# Patient Record
Sex: Male | Born: 1950
Health system: Southern US, Community
[De-identification: ages and names within clinical notes are randomized; demographics above are authoritative.]

## PROBLEM LIST (undated history)

## (undated) DIAGNOSIS — I493 Ventricular premature depolarization: Secondary | ICD-10-CM

## (undated) DIAGNOSIS — J40 Bronchitis, not specified as acute or chronic: Secondary | ICD-10-CM

## (undated) DIAGNOSIS — E785 Hyperlipidemia, unspecified: Secondary | ICD-10-CM

## (undated) DIAGNOSIS — J0100 Acute maxillary sinusitis, unspecified: Secondary | ICD-10-CM

## (undated) DIAGNOSIS — I10 Essential (primary) hypertension: Secondary | ICD-10-CM

## (undated) DIAGNOSIS — E1165 Type 2 diabetes mellitus with hyperglycemia: Secondary | ICD-10-CM

## (undated) DIAGNOSIS — E782 Mixed hyperlipidemia: Secondary | ICD-10-CM

## (undated) DIAGNOSIS — Z0000 Encounter for general adult medical examination without abnormal findings: Secondary | ICD-10-CM

## (undated) HISTORY — DX: Mixed hyperlipidemia: E78.2

## (undated) HISTORY — DX: Encounter for general adult medical examination without abnormal findings: Z00.00

## (undated) HISTORY — DX: Essential (primary) hypertension: I10

## (undated) HISTORY — DX: Type 2 diabetes mellitus with hyperglycemia: E11.65

## (undated) HISTORY — PX: SHOULDER SURGERY: SHX246

## (undated) HISTORY — DX: Ventricular premature depolarization: I49.3

## (undated) HISTORY — DX: Hyperlipidemia, unspecified: E78.5

## (undated) HISTORY — DX: Bronchitis, not specified as acute or chronic: J40

## (undated) HISTORY — DX: Acute maxillary sinusitis, unspecified: J01.00

---

## 2012-12-10 HISTORY — PX: COLONOSCOPY: SHX174

## 2015-03-31 DIAGNOSIS — I493 Ventricular premature depolarization: Secondary | ICD-10-CM

## 2015-03-31 DIAGNOSIS — E785 Hyperlipidemia, unspecified: Secondary | ICD-10-CM

## 2015-03-31 DIAGNOSIS — I10 Essential (primary) hypertension: Secondary | ICD-10-CM

## 2015-03-31 HISTORY — DX: Hyperlipidemia, unspecified: E78.5

## 2015-03-31 HISTORY — DX: Essential (primary) hypertension: I10

## 2015-03-31 HISTORY — DX: Ventricular premature depolarization: I49.3

## 2017-02-16 DIAGNOSIS — J40 Bronchitis, not specified as acute or chronic: Secondary | ICD-10-CM | POA: Insufficient documentation

## 2017-02-16 HISTORY — DX: Bronchitis, not specified as acute or chronic: J40

## 2017-02-20 DIAGNOSIS — J0100 Acute maxillary sinusitis, unspecified: Secondary | ICD-10-CM

## 2017-02-20 HISTORY — DX: Acute maxillary sinusitis, unspecified: J01.00

## 2017-03-10 DIAGNOSIS — E1165 Type 2 diabetes mellitus with hyperglycemia: Secondary | ICD-10-CM

## 2017-03-10 DIAGNOSIS — Z0000 Encounter for general adult medical examination without abnormal findings: Secondary | ICD-10-CM

## 2017-03-10 DIAGNOSIS — E782 Mixed hyperlipidemia: Secondary | ICD-10-CM

## 2017-03-10 HISTORY — DX: Type 2 diabetes mellitus with hyperglycemia: E11.65

## 2017-03-10 HISTORY — DX: Mixed hyperlipidemia: E78.2

## 2017-03-10 HISTORY — DX: Encounter for general adult medical examination without abnormal findings: Z00.00

## 2017-06-19 ENCOUNTER — Ambulatory Visit: Payer: Self-pay | Admitting: Cardiology

## 2017-06-22 NOTE — Progress Notes (Deleted)
Cardiology Office Note:    Date:  06/22/2017   ID:  Russell Wolf, DOB 12-20-1950, MRN [id number]  PCP:  Hadley Pen, MD  Cardiologist:  Norman Herrlich, MD    Referring MD: Hadley Pen, MD    ASSESSMENT:    No diagnosis found. PLAN:    In order of problems listed above:  1. ***   Next appointment: ***   Medication Adjustments/Labs and Tests Ordered: Current medicines are reviewed at length with the patient today.  Concerns regarding medicines are outlined above.  No orders of the defined types were placed in this encounter.  No orders of the defined types were placed in this encounter.   No chief complaint on file.   History of Present Illness:    Russell Wolf is a 67 y.o. male with a hx of Dyslipidemia, HTN, and PVCs  last seen 04/06/16. Compliance with diet, lifestyle and medications: *** Past Medical History:  Diagnosis Date  . Acute non-recurrent maxillary sinusitis 02/20/2017  . Annual physical exam 03/10/2017  . Bronchitis 02/16/2017  . Essential hypertension 03/31/2015  . Hyperlipidemia 03/31/2015  . Mixed dyslipidemia 03/10/2017  . PVC's (premature ventricular contractions) 03/31/2015  . Type 2 diabetes mellitus with hyperglycemia, without long-term current use of insulin (HCC) 03/10/2017    *** The histories are not reviewed yet. Please review them in the "History" navigator section and refresh this SmartLink.  Current Medications: No outpatient medications have been marked as taking for the 06/23/17 encounter (Appointment) with Baldo Daub, MD.     Allergies:   Niacin and Levofloxacin   Social History   Socioeconomic History  . Marital status: Married    Spouse name: Not on file  . Number of children: Not on file  . Years of education: Not on file  . Highest education level: Not on file  Occupational History  . Not on file  Social Needs  . Financial resource strain: Not on file  . Food insecurity:    Worry: Not on file   Inability: Not on file  . Transportation needs:    Medical: Not on file    Non-medical: Not on file  Tobacco Use  . Smoking status: Never Smoker  . Smokeless tobacco: Never Used  Substance and Sexual Activity  . Alcohol use: Not on file  . Drug use: Not on file  . Sexual activity: Not on file  Lifestyle  . Physical activity:    Days per week: Not on file    Minutes per session: Not on file  . Stress: Not on file  Relationships  . Social connections:    Talks on phone: Not on file    Gets together: Not on file    Attends religious service: Not on file    Active member of club or organization: Not on file    Attends meetings of clubs or organizations: Not on file    Relationship status: Not on file  Other Topics Concern  . Not on file  Social History Narrative  . Not on file     Family History: The patient's ***family history includes CAD in his father. ROS:   Please see the history of present illness.    All other systems reviewed and are negative.  EKGs/Labs/Other Studies Reviewed:    The following studies were reviewed today:  EKG:  EKG ordered today.  The ekg ordered today demonstrates ***  Recent Labs:   06/14/17  CMP normal A1C 6.3% No results found for  requested labs within last 8760 hours.  Recent Lipid Panel   Chol 97 HDL 32 LDL 54 No results found for: CHOL, TRIG, HDL, CHOLHDL, VLDL, LDLCALC, LDLDIRECT  Physical Exam:    VS:  There were no vitals taken for this visit.    Wt Readings from Last 3 Encounters:  No data found for Wt     GEN: *** Well nourished, well developed in no acute distress HEENT: Normal NECK: No JVD; No carotid bruits LYMPHATICS: No lymphadenopathy CARDIAC: ***RRR, no murmurs, rubs, gallops RESPIRATORY:  Clear to auscultation without rales, wheezing or rhonchi  ABDOMEN: Soft, non-tender, non-distended MUSCULOSKELETAL:  No edema; No deformity  SKIN: Warm and dry NEUROLOGIC:  Alert and oriented x 3 PSYCHIATRIC:  Normal affect      Signed, Norman HerrlichBrian Munley, MD  06/22/2017 8:31 PM    [facility]

## 2017-06-23 ENCOUNTER — Ambulatory Visit (INDEPENDENT_AMBULATORY_CARE_PROVIDER_SITE_OTHER): Payer: 59 | Admitting: Cardiology

## 2017-06-23 ENCOUNTER — Encounter: Payer: Self-pay | Admitting: Cardiology

## 2017-06-23 ENCOUNTER — Ambulatory Visit: Payer: Self-pay | Admitting: Cardiology

## 2017-06-23 VITALS — BP 102/70 | HR 69 | Ht 74.0 in | Wt 191.0 lb

## 2017-06-23 DIAGNOSIS — E7800 Pure hypercholesterolemia, unspecified: Secondary | ICD-10-CM

## 2017-06-23 DIAGNOSIS — I10 Essential (primary) hypertension: Secondary | ICD-10-CM | POA: Diagnosis not present

## 2017-06-23 DIAGNOSIS — E1165 Type 2 diabetes mellitus with hyperglycemia: Secondary | ICD-10-CM

## 2017-06-23 DIAGNOSIS — I493 Ventricular premature depolarization: Secondary | ICD-10-CM

## 2017-06-23 NOTE — Patient Instructions (Addendum)

## 2017-06-23 NOTE — Progress Notes (Signed)
Cardiology Office Note:    Date:  06/23/2017   ID:  Russell Otoavid Getter, DOB January 24, 1951, MRN [id number]  PCP:  Hadley Penobbins, Robert A, MD  Cardiologist:  Norman HerrlichBrian Munley, MD    Referring MD: Hadley Penobbins, Robert A, MD    ASSESSMENT:    1. Essential hypertension   2. PVC's (premature ventricular contractions)   3. Pure hypercholesterolemia   4. Type 2 diabetes mellitus with hyperglycemia, without long-term current use of insulin (HCC)    PLAN:    In order of problems listed above:  1. Stable blood pressure target continue treatment beta-blocker 2. Stable continue beta-blocker 3. Stable continue statin recent lipids are at target 4. Stable well controlled continue metformin   Next appointment: One year   Medication Adjustments/Labs and Tests Ordered: Current medicines are reviewed at length with the patient today.  Concerns regarding medicines are outlined above.  Orders Placed This Encounter  Procedures  . EKG 12-Lead   No orders of the defined types were placed in this encounter.   Chief Complaint  Patient presents with  . Follow-up    PVC's  . Medication Refill    Amlodipine  . Hypertension  . Hyperlipidemia    History of Present Illness:    Russell Wolf is a 67 y.o. male with a hx of Dyslipidemia, HTN, and PVCs  last seen 04/06/16. Compliance with diet, lifestyle and medications: yes He has had a good year no breakthrough episodes of palpitation remains active has had no chest pain syncope or shortness of breath. Past Medical History:  Diagnosis Date  . Acute non-recurrent maxillary sinusitis 02/20/2017  . Annual physical exam 03/10/2017  . Bronchitis 02/16/2017  . Essential hypertension 03/31/2015  . Hyperlipidemia 03/31/2015  . Mixed dyslipidemia 03/10/2017  . PVC's (premature ventricular contractions) 03/31/2015  . Type 2 diabetes mellitus with hyperglycemia, without long-term current use of insulin (HCC) 03/10/2017    Past Surgical History:  Procedure Laterality Date    . SHOULDER SURGERY      Current Medications: Current Meds  Medication Sig  . amLODipine-benazepril (LOTREL) 5-20 MG capsule Take by mouth.  Marland Kitchen. aspirin EC 81 MG tablet Take by mouth.  . carvedilol (COREG) 25 MG tablet Take by mouth.  . DOCOSAHEXAENOIC ACID PO Take 1 g by mouth.  . IRON PO Take by mouth daily.  Marland Kitchen. loratadine (CLARITIN) 10 MG tablet Take by mouth.  . metFORMIN (GLUCOPHAGE) 1000 MG tablet Take by mouth.  . Multiple Vitamin (MULTI-VITAMINS) TABS Take by mouth.  . rosuvastatin (CRESTOR) 20 MG tablet Take 10 mg by mouth 2 (two) times a week.     Allergies:   Niacin and Levofloxacin   Social History   Socioeconomic History  . Marital status: Married    Spouse name: Not on file  . Number of children: Not on file  . Years of education: Not on file  . Highest education level: Not on file  Occupational History  . Not on file  Social Needs  . Financial resource strain: Not on file  . Food insecurity:    Worry: Not on file    Inability: Not on file  . Transportation needs:    Medical: Not on file    Non-medical: Not on file  Tobacco Use  . Smoking status: Never Smoker  . Smokeless tobacco: Never Used  Substance and Sexual Activity  . Alcohol use: Not on file  . Drug use: Not on file  . Sexual activity: Not on file  Lifestyle  . Physical  activity:    Days per week: Not on file    Minutes per session: Not on file  . Stress: Not on file  Relationships  . Social connections:    Talks on phone: Not on file    Gets together: Not on file    Attends religious service: Not on file    Active member of club or organization: Not on file    Attends meetings of clubs or organizations: Not on file    Relationship status: Not on file  Other Topics Concern  . Not on file  Social History Narrative  . Not on file     Family History: The patient's family history includes Breast cancer in his mother; CAD in his father; Heart disease in his brother and maternal uncle;  Supraventricular tachycardia in his mother. ROS:   Please see the history of present illness.    All other systems reviewed and are negative.  EKGs/Labs/Other Studies Reviewed:    The following studies were reviewed today:  EKG:  EKG ordered today.  The ekg ordered today demonstrates SRTH normal  Recent Labs: Hgb A1c, CMP normal No results found for requested labs within last 8760 hours.  Recent Lipid Panel  Chol 97 HDL 32 LDL 54 No results found for: CHOL, TRIG, HDL, CHOLHDL, VLDL, LDLCALC, LDLDIRECT  Physical Exam:    VS:  BP 102/70 (BP Location: Right Arm, Patient Position: Sitting, Cuff Size: Normal)   Pulse 69   Ht 6\' 2"  (1.88 m)   Wt 191 lb (86.6 kg)   SpO2 98%   BMI 24.52 kg/m     Wt Readings from Last 3 Encounters:  06/23/17 191 lb (86.6 kg)     GEN:  Well nourished, well developed in no acute distress HEENT: Normal NECK: No JVD; No carotid bruits LYMPHATICS: No lymphadenopathy CARDIAC: RRR, no murmurs, rubs, gallops RESPIRATORY:  Clear to auscultation without rales, wheezing or rhonchi  ABDOMEN: Soft, non-tender, non-distended MUSCULOSKELETAL:  No edema; No deformity  SKIN: Warm and dry NEUROLOGIC:  Alert and oriented x 3 PSYCHIATRIC:  Normal affect    Signed, Norman Herrlich, MD  06/23/2017 12:48 PM    [facility]

## 2017-07-03 ENCOUNTER — Telehealth: Payer: Self-pay | Admitting: Cardiology

## 2017-07-03 MED ORDER — AMLODIPINE BESY-BENAZEPRIL HCL 5-20 MG PO CAPS: 1.0000 | ORAL_CAPSULE | Freq: Every day | ORAL | 3 refills | Status: DC

## 2017-07-03 NOTE — Telephone Encounter (Signed)
Refill sent.

## 2017-07-03 NOTE — Telephone Encounter (Signed)
Call amlodapine to [facility]

## 2017-09-04 DIAGNOSIS — G609 Hereditary and idiopathic neuropathy, unspecified: Secondary | ICD-10-CM | POA: Insufficient documentation

## 2017-09-04 DIAGNOSIS — M545 Low back pain, unspecified: Secondary | ICD-10-CM | POA: Insufficient documentation

## 2017-09-04 DIAGNOSIS — Z6825 Body mass index (BMI) 25.0-25.9, adult: Secondary | ICD-10-CM | POA: Insufficient documentation

## 2017-09-04 DIAGNOSIS — M5412 Radiculopathy, cervical region: Secondary | ICD-10-CM | POA: Insufficient documentation

## 2017-09-04 DIAGNOSIS — L909 Atrophic disorder of skin, unspecified: Secondary | ICD-10-CM | POA: Insufficient documentation

## 2017-09-04 DIAGNOSIS — G629 Polyneuropathy, unspecified: Secondary | ICD-10-CM | POA: Insufficient documentation

## 2017-09-04 DIAGNOSIS — N529 Male erectile dysfunction, unspecified: Secondary | ICD-10-CM | POA: Insufficient documentation

## 2017-09-08 ENCOUNTER — Telehealth: Payer: Self-pay | Admitting: Cardiology

## 2017-09-08 MED ORDER — CARVEDILOL 25 MG PO TABS: 25.0000 mg | ORAL_TABLET | Freq: Two times a day (BID) | ORAL | 2 refills | Status: DC

## 2017-09-08 MED ORDER — AMLODIPINE BESY-BENAZEPRIL HCL 5-20 MG PO CAPS: 1.0000 | ORAL_CAPSULE | Freq: Every day | ORAL | 2 refills | Status: DC

## 2017-09-08 NOTE — Telephone Encounter (Signed)
rx sent to [facility] as requested

## 2017-09-08 NOTE — Telephone Encounter (Signed)
Call cavadolol and amlodapine to [facility] #[phone]

## 2017-09-21 ENCOUNTER — Telehealth: Payer: Self-pay

## 2017-09-21 MED ORDER — ROSUVASTATIN CALCIUM 20 MG PO TABS: 10.0000 mg | ORAL_TABLET | ORAL | 11 refills | Status: DC

## 2017-09-21 NOTE — Telephone Encounter (Signed)
Rx for Crestor sent to pharmacy as requested.

## 2017-09-29 ENCOUNTER — Telehealth: Payer: Self-pay | Admitting: Cardiology

## 2017-09-29 NOTE — Telephone Encounter (Signed)
Has question about rosuvastatin dosage

## 2017-09-29 NOTE — Telephone Encounter (Signed)
[facility] will call [facility] with answer.

## 2017-10-02 ENCOUNTER — Other Ambulatory Visit: Payer: Self-pay

## 2017-10-02 MED ORDER — ROSUVASTATIN CALCIUM 20 MG PO TABS: 10.0000 mg | ORAL_TABLET | Freq: Every day | ORAL | 11 refills | Status: DC

## 2017-10-02 NOTE — Telephone Encounter (Signed)
Patient states that he had been taking crestor more frequently previously and wanted to clarify dose that was sent in?

## 2017-10-02 NOTE — Telephone Encounter (Signed)
Patient has been taking 10 mg daily. Informed patient to keep this dose. Patient was agreeable, change in epic has been made.

## 2017-10-02 NOTE — Telephone Encounter (Signed)
I have no knowledge except what is in the chart under medications. How is he taking it? If tolerated I would keep that dose and frequency.

## 2017-12-08 ENCOUNTER — Telehealth: Payer: Self-pay | Admitting: Cardiology

## 2017-12-08 MED ORDER — CARVEDILOL 25 MG PO TABS: 12.5000 mg | ORAL_TABLET | Freq: Two times a day (BID) | ORAL | 1 refills | Status: DC

## 2017-12-08 MED ORDER — ROSUVASTATIN CALCIUM 10 MG PO TABS: 10.0000 mg | ORAL_TABLET | Freq: Every day | ORAL | 1 refills | Status: DC

## 2017-12-08 NOTE — Telephone Encounter (Signed)
Patient came by the office this am and has some concerns abouth the meds called in for his Carvedilol and Rosuvastatin. He only takes the Carvedilol 1 time daily and the   Rosuvastatin eh takes 10 mg and we called in 20 mg, Please call patient.

## 2017-12-08 NOTE — Telephone Encounter (Signed)
Refill for rosuvastatin (crestor) 10 mg tablets sent to [facility] as requested by patient. Patient was confused about how he is supposed to be taking carvedilol. Verified with Dr. Dulce Sellar that patient can continue carvedilol (coreg) 0.5 tablet (12.5 mg) twice daily as he has been taking it for the past 20 years. Medication dosage has been corrected in our system.

## 2017-12-08 NOTE — Telephone Encounter (Signed)
Left message to return call on cell phone, no answer on home phone.

## 2018-03-14 DIAGNOSIS — G609 Hereditary and idiopathic neuropathy, unspecified: Secondary | ICD-10-CM | POA: Diagnosis not present

## 2018-03-14 DIAGNOSIS — E1165 Type 2 diabetes mellitus with hyperglycemia: Secondary | ICD-10-CM | POA: Diagnosis not present

## 2018-03-14 DIAGNOSIS — Z0000 Encounter for general adult medical examination without abnormal findings: Secondary | ICD-10-CM | POA: Diagnosis not present

## 2018-03-14 DIAGNOSIS — I10 Essential (primary) hypertension: Secondary | ICD-10-CM | POA: Diagnosis not present

## 2018-03-14 DIAGNOSIS — E782 Mixed hyperlipidemia: Secondary | ICD-10-CM | POA: Diagnosis not present

## 2018-03-14 DIAGNOSIS — Z1211 Encounter for screening for malignant neoplasm of colon: Secondary | ICD-10-CM | POA: Diagnosis not present

## 2018-03-14 DIAGNOSIS — Z125 Encounter for screening for malignant neoplasm of prostate: Secondary | ICD-10-CM | POA: Diagnosis not present

## 2018-03-14 DIAGNOSIS — Z23 Encounter for immunization: Secondary | ICD-10-CM | POA: Diagnosis not present

## 2018-03-19 ENCOUNTER — Other Ambulatory Visit: Payer: Self-pay | Admitting: Cardiology

## 2018-04-11 DIAGNOSIS — R195 Other fecal abnormalities: Secondary | ICD-10-CM | POA: Diagnosis not present

## 2018-04-20 DIAGNOSIS — Z86010 Personal history of colonic polyps: Secondary | ICD-10-CM | POA: Diagnosis not present

## 2018-04-20 DIAGNOSIS — I10 Essential (primary) hypertension: Secondary | ICD-10-CM | POA: Diagnosis not present

## 2018-04-20 DIAGNOSIS — Z09 Encounter for follow-up examination after completed treatment for conditions other than malignant neoplasm: Secondary | ICD-10-CM | POA: Diagnosis not present

## 2018-04-20 DIAGNOSIS — E7800 Pure hypercholesterolemia, unspecified: Secondary | ICD-10-CM | POA: Diagnosis not present

## 2018-04-20 DIAGNOSIS — Z7984 Long term (current) use of oral hypoglycemic drugs: Secondary | ICD-10-CM | POA: Diagnosis not present

## 2018-04-20 DIAGNOSIS — E1140 Type 2 diabetes mellitus with diabetic neuropathy, unspecified: Secondary | ICD-10-CM | POA: Diagnosis not present

## 2018-04-20 DIAGNOSIS — Z79899 Other long term (current) drug therapy: Secondary | ICD-10-CM | POA: Diagnosis not present

## 2018-04-20 DIAGNOSIS — M5412 Radiculopathy, cervical region: Secondary | ICD-10-CM | POA: Diagnosis not present

## 2018-04-20 DIAGNOSIS — Z1211 Encounter for screening for malignant neoplasm of colon: Secondary | ICD-10-CM | POA: Diagnosis not present

## 2018-04-20 DIAGNOSIS — Z7982 Long term (current) use of aspirin: Secondary | ICD-10-CM | POA: Diagnosis not present

## 2018-06-11 ENCOUNTER — Other Ambulatory Visit: Payer: Self-pay | Admitting: Cardiology

## 2018-06-22 ENCOUNTER — Telehealth: Payer: Self-pay

## 2018-06-22 NOTE — Telephone Encounter (Signed)
Left message for patient to return call to schedule appointment as he is due to see Dr Dulce Sellar in April.

## 2018-06-25 NOTE — Telephone Encounter (Signed)
   Primary Cardiologist:  [facility]  Patient contacted.  History reviewed.  No symptoms to suggest any unstable cardiac conditions.  Based on discussion, with current pandemic situation, we will be postponing this appointment for Russell Wolf with a plan for f/u August 28,2020 or sooner if feasible/necessary.  If symptoms change, he has been instructed to contact our office.  Patient agreed to plan and verbalized understanding.    Lamona Curl, CMA  06/25/2018 4:10 PM         .

## 2018-09-17 ENCOUNTER — Other Ambulatory Visit: Payer: Self-pay | Admitting: Cardiology

## 2018-10-17 ENCOUNTER — Other Ambulatory Visit: Payer: Self-pay | Admitting: *Deleted

## 2018-10-17 MED ORDER — ROSUVASTATIN CALCIUM 10 MG PO TABS: ORAL_TABLET | ORAL | 0 refills | Status: DC

## 2018-11-02 ENCOUNTER — Ambulatory Visit: Payer: 59 | Admitting: Cardiology

## 2018-12-26 ENCOUNTER — Other Ambulatory Visit: Payer: Self-pay | Admitting: Cardiology

## 2018-12-31 ENCOUNTER — Ambulatory Visit: Payer: 59 | Admitting: Cardiology

## 2019-01-01 NOTE — Progress Notes (Signed)
Cardiology Office Note:    Date:  01/03/2019   ID:  Russell Wolf, DOB 1950/12/17, MRN [id number]  PCP:  Hadley Pen, MD  Cardiologist:  Norman Herrlich, MD    Referring MD: Hadley Pen, MD    ASSESSMENT:    1. PVC's (premature ventricular contractions)   2. Essential hypertension   3. Pure hypercholesterolemia    PLAN:    In order of problems listed above:  1. Stable automatic no PVCs on a screening EKG continue beta-blocker avoid over-the-counter proarrhythmic drugs and I do not think he requires an ambulatory event monitor at this time 2. Stable hypertension continue current treatment beta-blocker carvedilol 3. Stable continue his high intensity statin check liver function lipid profile lipoprotein a level.  Strong family history of CAD and premature CAD.   Next appointment: 12 months   Medication Adjustments/Labs and Tests Ordered: Current medicines are reviewed at length with the patient today.  Concerns regarding medicines are outlined above.  No orders of the defined types were placed in this encounter.  No orders of the defined types were placed in this encounter.   Chief Complaint  Patient presents with  . Annual Exam    PVC's  . Hypertension  . Hyperlipidemia    History of Present Illness:    Russell Wolf is a 68 y.o. male with a hx of  Dyslipidemia, HTN, and PVCs  last seen 06/23/2017. 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  He is pleased with the quality of his life his weight is stable he has had no palpitation chest pain edema shortness of breath or intercurrent illness.  His wife works at the hospital and I practiced all the precautions of Covid 19.  He is due for lab work which will be performed today including liver function lipid profile LP(a) level he tolerates a statin without muscular toxicity or side effects Past Medical History:  Diagnosis Date  . Acute  non-recurrent maxillary sinusitis 02/20/2017  . Annual physical exam 03/10/2017  . Bronchitis 02/16/2017  . Essential hypertension 03/31/2015  . Hyperlipidemia 03/31/2015  . Mixed dyslipidemia 03/10/2017  . PVC's (premature ventricular contractions) 03/31/2015  . Type 2 diabetes mellitus with hyperglycemia, without long-term current use of insulin (HCC) 03/10/2017    Past Surgical History:  Procedure Laterality Date  . SHOULDER SURGERY      Current Medications: Current Meds  Medication Sig  . amLODipine-benazepril (LOTREL) 5-20 MG capsule TAKE 1 CAPSULE BY MOUTH ONCE (1) DAILY  . aspirin EC 81 MG tablet Take by mouth.  . carvedilol (COREG) 25 MG tablet Take 12.5 mg by mouth 2 (two) times daily.  . DOCOSAHEXAENOIC ACID PO Take 1 g by mouth.  . IRON PO Take by mouth daily.  Marland Kitchen loratadine (CLARITIN) 10 MG tablet Take by mouth.  . metFORMIN (GLUCOPHAGE) 1000 MG tablet Take 1,000 mg by mouth 2 (two) times daily with a meal.   . Multiple Vitamin (MULTI-VITAMINS) TABS Take by mouth.  . rosuvastatin (CRESTOR) 20 MG tablet Take 20 mg by mouth daily.  . [DISCONTINUED] carvedilol (COREG) 25 MG tablet TAKE 1 TABLET BY MOUTH TWICE (2) DAILY WITH A MEAL (Patient taking differently: 12.5 mg. )     Allergies:   Niacin and Levofloxacin   Social History   Socioeconomic History  . Marital status: Married    Spouse name: Not on file  . Number of children: Not on file  . Years  of education: Not on file  . Highest education level: Not on file  Occupational History  . Not on file  Social Needs  . Financial resource strain: Not on file  . Food insecurity    Worry: Not on file    Inability: Not on file  . Transportation needs    Medical: Not on file    Non-medical: Not on file  Tobacco Use  . Smoking status: Never Smoker  . Smokeless tobacco: Never Used  Substance and Sexual Activity  . Alcohol use: Not on file  . Drug use: Not on file  . Sexual activity: Not on file  Lifestyle  . Physical  activity    Days per week: Not on file    Minutes per session: Not on file  . Stress: Not on file  Relationships  . Social Herbalist on phone: Not on file    Gets together: Not on file    Attends religious service: Not on file    Active member of club or organization: Not on file    Attends meetings of clubs or organizations: Not on file    Relationship status: Not on file  Other Topics Concern  . Not on file  Social History Narrative  . Not on file     Family History: The patient's family history includes Breast cancer in his mother; CAD in his father; Heart disease in his brother and maternal uncle; Supraventricular tachycardia in his mother. ROS:   Please see the history of present illness.    All other systems reviewed and are negative.  EKGs/Labs/Other Studies Reviewed:    The following studies were reviewed today:  EKG:  EKG ordered today and personally reviewed.  The ekg ordered today demonstrates sinus rhythm normal no PVCs  Recent Labs: No results found for requested labs within last 8760 hours.  Recent Lipid Panel No results found for: CHOL, TRIG, HDL, CHOLHDL, VLDL, LDLCALC, LDLDIRECT  Physical Exam:    VS:  BP 132/80 (BP Location: Right Arm, Patient Position: Sitting, Cuff Size: Normal)   Pulse 63   Ht 6\' 2"  (1.88 m)   Wt 192 lb (87.1 kg)   SpO2 98%   BMI 24.65 kg/m     Wt Readings from Last 3 Encounters:  01/03/19 192 lb (87.1 kg)  06/23/17 191 lb (86.6 kg)     GEN:  Well nourished, well developed in no acute distress HEENT: Normal NECK: No JVD; No carotid bruits LYMPHATICS: No lymphadenopathy CARDIAC: RRR, no murmurs, rubs, gallops RESPIRATORY:  Clear to auscultation without rales, wheezing or rhonchi  ABDOMEN: Soft, non-tender, non-distended MUSCULOSKELETAL:  No edema; No deformity  SKIN: Warm and dry NEUROLOGIC:  Alert and oriented x 3 PSYCHIATRIC:  Normal affect    Signed, Shirlee More, MD  01/03/2019 8:50 AM    [facility]

## 2019-01-03 ENCOUNTER — Other Ambulatory Visit: Payer: Self-pay

## 2019-01-03 ENCOUNTER — Encounter: Payer: Self-pay | Admitting: Cardiology

## 2019-01-03 ENCOUNTER — Ambulatory Visit (INDEPENDENT_AMBULATORY_CARE_PROVIDER_SITE_OTHER): Payer: PPO | Admitting: Cardiology

## 2019-01-03 VITALS — BP 132/80 | HR 63 | Ht 74.0 in | Wt 192.0 lb

## 2019-01-03 DIAGNOSIS — E782 Mixed hyperlipidemia: Secondary | ICD-10-CM

## 2019-01-03 DIAGNOSIS — I10 Essential (primary) hypertension: Secondary | ICD-10-CM | POA: Diagnosis not present

## 2019-01-03 DIAGNOSIS — E7800 Pure hypercholesterolemia, unspecified: Secondary | ICD-10-CM

## 2019-01-03 DIAGNOSIS — I493 Ventricular premature depolarization: Secondary | ICD-10-CM

## 2019-01-03 MED ORDER — AMLODIPINE BESY-BENAZEPRIL HCL 5-20 MG PO CAPS: ORAL_CAPSULE | ORAL | 2 refills | Status: DC

## 2019-01-03 NOTE — Addendum Note (Signed)
Addended by: Austin Miles on: 01/03/2019 09:02 AM   Modules accepted: Orders

## 2019-01-03 NOTE — Patient Instructions (Signed)
Medication Instructions:  Your physician recommends that you continue on your current medications as directed. Please refer to the Current Medication list given to you today.  *If you need a refill on your cardiac medications before your next appointment, please call your pharmacy*  Lab Work: Your physician recommends that you return for lab work today: CMP, lipid panel, lipoprotein A (LPa).   If you have labs (blood work) drawn today and your tests are completely normal, you will receive your results only by: Marland Kitchen MyChart Message (if you have MyChart) OR . A paper copy in the mail If you have any lab test that is abnormal or we need to change your treatment, we will call you to review the results.  Testing/Procedures: You had an EKG today.   Follow-Up: At [facility], you and your health needs are our priority.  As part of our continuing mission to provide you with exceptional heart care, we have created designated Provider Care Teams.  These Care Teams include your primary Cardiologist (physician) and Advanced Practice Providers (APPs -  Physician Assistants and Nurse Practitioners) who all work together to provide you with the care you need, when you need it.  Your next appointment:   12 months  The format for your next appointment:   In Person  Provider:   Shirlee More, MD

## 2019-01-04 LAB — COMPREHENSIVE METABOLIC PANEL
ALT: 24 IU/L (ref 0–44)
AST: 23 IU/L (ref 0–40)
Albumin/Globulin Ratio: 1.8 (ref 1.2–2.2)
Albumin: 4.6 g/dL (ref 3.8–4.8)
Alkaline Phosphatase: 61 IU/L (ref 39–117)
BUN/Creatinine Ratio: 23 (ref 10–24)
BUN: 25 mg/dL (ref 8–27)
Bilirubin Total: 0.5 mg/dL (ref 0.0–1.2)
CO2: 21 mmol/L (ref 20–29)
Calcium: 9.8 mg/dL (ref 8.6–10.2)
Chloride: 103 mmol/L (ref 96–106)
Creatinine, Ser: 1.1 mg/dL (ref 0.76–1.27)
GFR calc Af Amer: 80 mL/min/{1.73_m2} (ref >59)
GFR calc non Af Amer: 70 mL/min/{1.73_m2} (ref >59)
Globulin, Total: 2.6 g/dL (ref 1.5–4.5)
Glucose: 121 mg/dL — ABNORMAL HIGH (ref 65–99)
Potassium: 4.6 mmol/L (ref 3.5–5.2)
Sodium: 139 mmol/L (ref 134–144)
Total Protein: 7.2 g/dL (ref 6.0–8.5)

## 2019-01-04 LAB — LIPID PANEL
Chol/HDL Ratio: 3.9 ratio (ref 0.0–5.0)
Cholesterol, Total: 135 mg/dL (ref 100–199)
HDL: 35 mg/dL — ABNORMAL LOW (ref >39)
LDL Chol Calc (NIH): 69 mg/dL (ref 0–99)
Triglycerides: 184 mg/dL — ABNORMAL HIGH (ref 0–149)
VLDL Cholesterol Cal: 31 mg/dL (ref 5–40)

## 2019-01-04 LAB — LIPOPROTEIN A (LPA): Lipoprotein (a): <8.4 nmol/L (ref <75.0)

## 2019-02-20 DIAGNOSIS — E119 Type 2 diabetes mellitus without complications: Secondary | ICD-10-CM | POA: Diagnosis not present

## 2019-03-06 DIAGNOSIS — E1165 Type 2 diabetes mellitus with hyperglycemia: Secondary | ICD-10-CM | POA: Diagnosis not present

## 2019-03-21 ENCOUNTER — Other Ambulatory Visit: Payer: Self-pay | Admitting: *Deleted

## 2019-03-21 MED ORDER — ROSUVASTATIN CALCIUM 20 MG PO TABS: 20.0000 mg | ORAL_TABLET | Freq: Every day | ORAL | 1 refills | Status: DC

## 2019-03-27 ENCOUNTER — Other Ambulatory Visit: Payer: Self-pay | Admitting: Cardiology

## 2019-05-15 DIAGNOSIS — Z23 Encounter for immunization: Secondary | ICD-10-CM | POA: Diagnosis not present

## 2019-05-15 DIAGNOSIS — Z0000 Encounter for general adult medical examination without abnormal findings: Secondary | ICD-10-CM | POA: Diagnosis not present

## 2019-05-15 DIAGNOSIS — T24232A Burn of second degree of left lower leg, initial encounter: Secondary | ICD-10-CM | POA: Diagnosis not present

## 2019-05-24 DIAGNOSIS — T24232S Burn of second degree of left lower leg, sequela: Secondary | ICD-10-CM | POA: Diagnosis not present

## 2019-06-07 DIAGNOSIS — J0140 Acute pansinusitis, unspecified: Secondary | ICD-10-CM | POA: Diagnosis not present

## 2019-06-12 DIAGNOSIS — I10 Essential (primary) hypertension: Secondary | ICD-10-CM | POA: Diagnosis not present

## 2019-06-12 DIAGNOSIS — E1165 Type 2 diabetes mellitus with hyperglycemia: Secondary | ICD-10-CM | POA: Diagnosis not present

## 2019-06-12 DIAGNOSIS — Z1211 Encounter for screening for malignant neoplasm of colon: Secondary | ICD-10-CM | POA: Diagnosis not present

## 2019-06-12 DIAGNOSIS — E782 Mixed hyperlipidemia: Secondary | ICD-10-CM | POA: Diagnosis not present

## 2019-06-12 DIAGNOSIS — G609 Hereditary and idiopathic neuropathy, unspecified: Secondary | ICD-10-CM | POA: Diagnosis not present

## 2019-06-12 DIAGNOSIS — Z125 Encounter for screening for malignant neoplasm of prostate: Secondary | ICD-10-CM | POA: Diagnosis not present

## 2019-10-29 ENCOUNTER — Telehealth: Payer: Self-pay | Admitting: Cardiology

## 2019-10-29 DIAGNOSIS — E7800 Pure hypercholesterolemia, unspecified: Secondary | ICD-10-CM

## 2019-10-29 DIAGNOSIS — I10 Essential (primary) hypertension: Secondary | ICD-10-CM

## 2019-10-29 NOTE — Telephone Encounter (Signed)
Spoke to patients wife just now and let her know that Dr. Dulce Sellar does want fasting blood work done prior to the patients appointment. She states that she will let him know.

## 2019-10-29 NOTE — Telephone Encounter (Signed)
CMP and lipid profile prior to visit fasting

## 2019-10-29 NOTE — Telephone Encounter (Signed)
Fasting CMP and lipid profile

## 2019-10-29 NOTE — Telephone Encounter (Signed)
Patient states he gets lab work done yearly, but there are no orders. He would like to know if he will need to have it done before or during his appointment 01/08/2020.

## 2020-01-02 ENCOUNTER — Other Ambulatory Visit: Payer: Self-pay

## 2020-01-02 DIAGNOSIS — E7800 Pure hypercholesterolemia, unspecified: Secondary | ICD-10-CM | POA: Diagnosis not present

## 2020-01-02 DIAGNOSIS — I10 Essential (primary) hypertension: Secondary | ICD-10-CM | POA: Diagnosis not present

## 2020-01-02 LAB — COMPREHENSIVE METABOLIC PANEL
ALT: 25 IU/L (ref 0–44)
AST: 23 IU/L (ref 0–40)
Albumin/Globulin Ratio: 2 (ref 1.2–2.2)
Albumin: 4.7 g/dL (ref 3.8–4.8)
Alkaline Phosphatase: 61 IU/L (ref 44–121)
BUN/Creatinine Ratio: 21 (ref 10–24)
BUN: 24 mg/dL (ref 8–27)
Bilirubin Total: 0.5 mg/dL (ref 0.0–1.2)
CO2: 18 mmol/L — ABNORMAL LOW (ref 20–29)
Calcium: 10.1 mg/dL (ref 8.6–10.2)
Chloride: 102 mmol/L (ref 96–106)
Creatinine, Ser: 1.16 mg/dL (ref 0.76–1.27)
GFR calc Af Amer: 75 mL/min/{1.73_m2} (ref >59)
GFR calc non Af Amer: 65 mL/min/{1.73_m2} (ref >59)
Globulin, Total: 2.4 g/dL (ref 1.5–4.5)
Glucose: 137 mg/dL — ABNORMAL HIGH (ref 65–99)
Potassium: 4.7 mmol/L (ref 3.5–5.2)
Sodium: 136 mmol/L (ref 134–144)
Total Protein: 7.1 g/dL (ref 6.0–8.5)

## 2020-01-02 LAB — LIPID PANEL
Chol/HDL Ratio: 4.1 ratio (ref 0.0–5.0)
Cholesterol, Total: 116 mg/dL (ref 100–199)
HDL: 28 mg/dL — ABNORMAL LOW (ref >39)
LDL Chol Calc (NIH): 54 mg/dL (ref 0–99)
Triglycerides: 204 mg/dL — ABNORMAL HIGH (ref 0–149)
VLDL Cholesterol Cal: 34 mg/dL (ref 5–40)

## 2020-01-03 ENCOUNTER — Telehealth: Payer: Self-pay

## 2020-01-03 NOTE — Telephone Encounter (Signed)
Patient returning Morgan's phone call, connected call to [place].

## 2020-01-03 NOTE — Telephone Encounter (Signed)
Left message on patients voicemail to please return our call.   

## 2020-01-07 NOTE — Progress Notes (Signed)
Cardiology Office Note:    Date:  01/08/2020   ID:  Russell Wolf, DOB 10-Apr-1950, MRN [id number]  PCP:  Hadley Pen, MD  Cardiologist:  Norman Herrlich, MD    Referring MD: Hadley Pen, MD    ASSESSMENT:    1. PVC's (premature ventricular contractions)   2. Type 2 diabetes mellitus with hyperglycemia, without long-term current use of insulin (HCC)   3. Essential hypertension   4. Mixed dyslipidemia    PLAN:    In order of problems listed above:  1. He has had a good year little no symptomatic palpitations single PVC on his EKG continue beta-blocker I do not think we need to repeat monitors or consider antiarrhythmic drug at this time. 2. Stable A1c at target continue Metformin 3. BP at target continue current treatment including multidrug regimen beta-blocker ACE inhibitor calcium channel blocker. 4. Continue with statin LDL at target   Next appointment: 1 year   Medication Adjustments/Labs and Tests Ordered: Current medicines are reviewed at length with the patient today.  Concerns regarding medicines are outlined above.  Orders Placed This Encounter  Procedures  . EKG 12-Lead   No orders of the defined types were placed in this encounter.   Chief Complaint  Patient presents with  . Follow-up    With symptomatic PVCs  . Hyperlipidemia  . Hypertension    History of Present Illness:    Russell Wolf is a 69 y.o. male with a hx of symptomatic PVCs hypertension and hyper years ago onset down an hour reports single time lipidemia with a strong family history of premature CAD last seen 01/03/2019. Compliance with diet, lifestyle and medications: Yes  He continues to work part-time he is vigorous active little no palpitation no chest pain shortness of breath or edema.  He sporadically checks blood pressure at home and is at target.  His A1c is good 6.1%.  He tolerates his statin without muscle pain or weakness Past Medical History:  Diagnosis Date  . Acute  non-recurrent maxillary sinusitis 02/20/2017  . Annual physical exam 03/10/2017  . Bronchitis 02/16/2017  . Essential hypertension 03/31/2015  . Hyperlipidemia 03/31/2015  . Mixed dyslipidemia 03/10/2017  . PVC's (premature ventricular contractions) 03/31/2015  . Type 2 diabetes mellitus with hyperglycemia, without long-term current use of insulin (HCC) 03/10/2017    Past Surgical History:  Procedure Laterality Date  . SHOULDER SURGERY      Current Medications: Current Meds  Medication Sig  . amLODipine-benazepril (LOTREL) 5-20 MG capsule TAKE 1 CAPSULE BY MOUTH ONCE (1) DAILY  . aspirin EC 81 MG tablet Take by mouth daily.  . carvedilol (COREG) 25 MG tablet TAKE 1 TABLET BY MOUTH TWICE (2) DAILY WITH A MEAL  . IRON PO Take by mouth daily.  Marland Kitchen loratadine (CLARITIN) 10 MG tablet Take by mouth.  . metFORMIN (GLUCOPHAGE) 1000 MG tablet Take 1,000 mg by mouth 2 (two) times daily with a meal.   . Multiple Vitamin (MULTI-VITAMINS) TABS Take by mouth.  . Omega-3 Fatty Acids (FISH OIL) 1000 MG CAPS Take 1 capsule by mouth daily.  . rosuvastatin (CRESTOR) 20 MG tablet Take 1 tablet (20 mg total) by mouth daily.     Allergies:   Niacin and Levofloxacin   Social History   Socioeconomic History  . Marital status: Married    Spouse name: Not on file  . Number of children: Not on file  . Years of education: Not on file  . Highest education level: Not  on file  Occupational History  . Not on file  Tobacco Use  . Smoking status: Never Smoker  . Smokeless tobacco: Never Used  Substance and Sexual Activity  . Alcohol use: Not on file  . Drug use: Not on file  . Sexual activity: Not on file  Other Topics Concern  . Not on file  Social History Narrative  . Not on file   Social Determinants of Health   Financial Resource Strain:   . Difficulty of Paying Living Expenses: Not on file  Food Insecurity:   . Worried About Programme researcher, broadcasting/film/video in the Last Year: Not on file  . Ran Out of Food in  the Last Year: Not on file  Transportation Needs:   . Lack of Transportation (Medical): Not on file  . Lack of Transportation (Non-Medical): Not on file  Physical Activity:   . Days of Exercise per Week: Not on file  . Minutes of Exercise per Session: Not on file  Stress:   . Feeling of Stress : Not on file  Social Connections:   . Frequency of Communication with Friends and Family: Not on file  . Frequency of Social Gatherings with Friends and Family: Not on file  . Attends Religious Services: Not on file  . Active Member of Clubs or Organizations: Not on file  . Attends Banker Meetings: Not on file  . Marital Status: Not on file     Family History: The patient's family history includes Breast cancer in his mother; CAD in his father; Heart disease in his brother and maternal uncle; Supraventricular tachycardia in his mother. ROS:   Please see the history of present illness.    All other systems reviewed and are negative.  EKGs/Labs/Other Studies Reviewed:    The following studies were reviewed today:  EKG:  EKG ordered today and personally reviewed.  The ekg ordered today demonstrates sinus rhythm 1 PVC otherwise normal 06/12/2019 hemoglobin A1c 6.1% Recent Labs: 01/02/2020: ALT 25; BUN 24; Creatinine, Ser 1.16; Potassium 4.7; Sodium 136  Recent Lipid Panel    Component Value Date/Time   CHOL 116 01/02/2020 0901   TRIG 204 (H) 01/02/2020 0901   HDL 28 (L) 01/02/2020 0901   CHOLHDL 4.1 01/02/2020 0901   LDLCALC 54 01/02/2020 0901    Physical Exam:    VS:  BP 134/82   Pulse 70   Ht 6\' 2"  (1.88 m)   Wt 202 lb 12.8 oz (92 kg)   SpO2 97%   BMI 26.04 kg/m     Wt Readings from Last 3 Encounters:  01/08/20 202 lb 12.8 oz (92 kg)  01/03/19 192 lb (87.1 kg)  06/23/17 191 lb (86.6 kg)     GEN:  Well nourished, well developed in no acute distress HEENT: Normal NECK: No JVD; No carotid bruits LYMPHATICS: No lymphadenopathy CARDIAC: RRR, no murmurs,  rubs, gallops RESPIRATORY:  Clear to auscultation without rales, wheezing or rhonchi  ABDOMEN: Soft, non-tender, non-distended MUSCULOSKELETAL:  No edema; No deformity  SKIN: Warm and dry NEUROLOGIC:  Alert and oriented x 3 PSYCHIATRIC:  Normal affect    Signed, 06/25/17, MD  01/08/2020 1:26 PM    [facility]

## 2020-01-08 ENCOUNTER — Other Ambulatory Visit: Payer: Self-pay

## 2020-01-08 ENCOUNTER — Encounter: Payer: Self-pay | Admitting: Cardiology

## 2020-01-08 ENCOUNTER — Ambulatory Visit: Payer: PPO | Admitting: Cardiology

## 2020-01-08 VITALS — BP 134/82 | HR 70 | Ht 74.0 in | Wt 202.8 lb

## 2020-01-08 DIAGNOSIS — E1165 Type 2 diabetes mellitus with hyperglycemia: Secondary | ICD-10-CM | POA: Diagnosis not present

## 2020-01-08 DIAGNOSIS — E782 Mixed hyperlipidemia: Secondary | ICD-10-CM

## 2020-01-08 DIAGNOSIS — Z23 Encounter for immunization: Secondary | ICD-10-CM

## 2020-01-08 DIAGNOSIS — I493 Ventricular premature depolarization: Secondary | ICD-10-CM

## 2020-01-08 DIAGNOSIS — I10 Essential (primary) hypertension: Secondary | ICD-10-CM

## 2020-01-08 NOTE — Patient Instructions (Signed)
Medication Instructions:  Your physician recommends that you continue on your current medications as directed. Please refer to the Current Medication list given to you today.  *If you need a refill on your cardiac medications before your next appointment, please call your pharmacy*   Lab Work: None If you have labs (blood work) drawn today and your tests are completely normal, you will receive your results only by:  MyChart Message (if you have MyChart) OR  A paper copy in the mail If you have any lab test that is abnormal or we need to change your treatment, we will call you to review the results.   Testing/Procedures: None   Follow-Up: At [facility], you and your health needs are our priority.  As part of our continuing mission to provide you with exceptional heart care, we have created designated Provider Care Teams.  These Care Teams include your primary Cardiologist (physician) and Advanced Practice Providers (APPs -  Physician Assistants and Nurse Practitioners) who all work together to provide you with the care you need, when you need it.  We recommend signing up for the patient portal called "MyChart".  Sign up information is provided on this After Visit Summary.  MyChart is used to connect with patients for Virtual Visits (Telemedicine).  Patients are able to view lab/test results, encounter notes, upcoming appointments, etc.  Non-urgent messages can be sent to your provider as well.   To learn more about what you can do with MyChart, go to [email or web address].    Your next appointment:   1 year(s)  The format for your next appointment:   In Person  Provider:   Dulce Sellar, MD   Other Instructions

## 2020-03-05 DIAGNOSIS — E1165 Type 2 diabetes mellitus with hyperglycemia: Secondary | ICD-10-CM | POA: Diagnosis not present

## 2020-03-05 DIAGNOSIS — E782 Mixed hyperlipidemia: Secondary | ICD-10-CM | POA: Diagnosis not present

## 2020-03-05 DIAGNOSIS — I10 Essential (primary) hypertension: Secondary | ICD-10-CM | POA: Diagnosis not present

## 2020-04-01 DIAGNOSIS — E119 Type 2 diabetes mellitus without complications: Secondary | ICD-10-CM | POA: Diagnosis not present

## 2020-04-23 DIAGNOSIS — B351 Tinea unguium: Secondary | ICD-10-CM | POA: Diagnosis not present

## 2020-04-23 DIAGNOSIS — L821 Other seborrheic keratosis: Secondary | ICD-10-CM | POA: Diagnosis not present

## 2020-04-23 DIAGNOSIS — L301 Dyshidrosis [pompholyx]: Secondary | ICD-10-CM | POA: Diagnosis not present

## 2020-04-23 DIAGNOSIS — L578 Other skin changes due to chronic exposure to nonionizing radiation: Secondary | ICD-10-CM | POA: Diagnosis not present

## 2020-04-28 DIAGNOSIS — G609 Hereditary and idiopathic neuropathy, unspecified: Secondary | ICD-10-CM | POA: Diagnosis not present

## 2020-05-15 DIAGNOSIS — E1165 Type 2 diabetes mellitus with hyperglycemia: Secondary | ICD-10-CM | POA: Diagnosis not present

## 2020-05-15 DIAGNOSIS — I10 Essential (primary) hypertension: Secondary | ICD-10-CM | POA: Diagnosis not present

## 2020-05-15 DIAGNOSIS — E782 Mixed hyperlipidemia: Secondary | ICD-10-CM | POA: Diagnosis not present

## 2020-05-15 DIAGNOSIS — Z0000 Encounter for general adult medical examination without abnormal findings: Secondary | ICD-10-CM | POA: Diagnosis not present

## 2020-07-16 DIAGNOSIS — H11422 Conjunctival edema, left eye: Secondary | ICD-10-CM | POA: Diagnosis not present

## 2020-08-17 ENCOUNTER — Other Ambulatory Visit: Payer: Self-pay

## 2020-08-17 ENCOUNTER — Other Ambulatory Visit: Payer: Self-pay | Admitting: Cardiology

## 2020-08-17 ENCOUNTER — Telehealth: Payer: Self-pay | Admitting: Cardiology

## 2020-08-17 MED ORDER — CARVEDILOL 25 MG PO TABS: ORAL_TABLET | ORAL | 3 refills | Status: DC

## 2020-08-17 MED ORDER — AMLODIPINE BESY-BENAZEPRIL HCL 5-20 MG PO CAPS: ORAL_CAPSULE | ORAL | 3 refills | Status: DC

## 2020-08-17 NOTE — Telephone Encounter (Signed)
*  STAT* If patient is at the pharmacy, call can be transferred to refill team.   1. Which medications need to be refilled? (please list name of each medication and dose if known)  amLODipine-benazepril (LOTREL) 5-20 MG capsule carvedilol (COREG) 25 MG tablet  2. Which pharmacy/location (including street and city if local pharmacy) is medication to be sent to? [facility] - [place], [place] - [address]  3. Do they need a 30 day or 90 day supply? 90

## 2020-08-18 NOTE — Telephone Encounter (Signed)
Refill sent to pharmacy.   

## 2020-09-18 ENCOUNTER — Encounter: Payer: Self-pay | Admitting: Cardiology

## 2020-09-18 ENCOUNTER — Ambulatory Visit: Payer: PPO | Admitting: Cardiology

## 2020-09-18 ENCOUNTER — Other Ambulatory Visit: Payer: Self-pay

## 2020-09-18 VITALS — BP 118/70 | HR 81 | Ht 74.0 in | Wt 196.8 lb

## 2020-09-18 DIAGNOSIS — I10 Essential (primary) hypertension: Secondary | ICD-10-CM

## 2020-09-18 DIAGNOSIS — Z7189 Other specified counseling: Secondary | ICD-10-CM | POA: Diagnosis not present

## 2020-09-18 DIAGNOSIS — E782 Mixed hyperlipidemia: Secondary | ICD-10-CM

## 2020-09-18 DIAGNOSIS — I493 Ventricular premature depolarization: Secondary | ICD-10-CM

## 2020-09-18 DIAGNOSIS — E1165 Type 2 diabetes mellitus with hyperglycemia: Secondary | ICD-10-CM | POA: Diagnosis not present

## 2020-09-18 NOTE — Addendum Note (Signed)
Addended by: Eleonore Chiquito on: 09/18/2020 02:00 PM   Modules accepted: Orders

## 2020-09-18 NOTE — Patient Instructions (Addendum)
Medication Instructions:  No medication changes. *If you need a refill on your cardiac medications before your next appointment, please call your pharmacy*   Lab Work: None ordered If you have labs (blood work) drawn today and your tests are completely normal, you will receive your results only by: MyChart Message (if you have MyChart) OR A paper copy in the mail If you have any lab test that is abnormal or we need to change your treatment, we will call you to review the results.   Testing/Procedures: We will order CT coronary calcium score. It will cost $99.00 and is not covered by insurance.  Please call [phone] to schedule.   [facility]  [address]  [place], [place] [address]    Follow-Up: At [facility], you and your health needs are our priority.  As part of our continuing mission to provide you with exceptional heart care, we have created designated Provider Care Teams.  These Care Teams include your primary Cardiologist (physician) and Advanced Practice Providers (APPs -  Physician Assistants and Nurse Practitioners) who all work together to provide you with the care you need, when you need it.  We recommend signing up for the patient portal called "MyChart".  Sign up information is provided on this After Visit Summary.  MyChart is used to connect with patients for Virtual Visits (Telemedicine).  Patients are able to view lab/test results, encounter notes, upcoming appointments, etc.  Non-urgent messages can be sent to your provider as well.   To learn more about what you can do with MyChart, go to [email or web address].    Your next appointment:   12 month(s)  The format for your next appointment:   In Person  Provider:   Norman Herrlich, MD   Other Instructions NA

## 2020-09-18 NOTE — Progress Notes (Signed)
Cardiology Office Note:    Date:  09/18/2020   ID:  Russell Wolf, DOB [birth year], MRN [id number]  PCP:  Hadley Pen, MD  Cardiologist:  Norman Herrlich, MD    Referring MD: Hadley Pen, MD    ASSESSMENT:    1. Cardiac risk counseling   2. Essential hypertension   3. Mixed dyslipidemia   4. PVC's (premature ventricular contractions)   5. Type 2 diabetes mellitus with hyperglycemia, without long-term current use of insulin (HCC)    PLAN:    In order of problems listed above:  He has multiple cardiovascular risk especially as marked family history.  He is on appropriate medical therapy and has hyperlipidemia hypertension and diabetes are well controlled.  After discussion we will go ahead and do a cardiac coronary calcium score he understands he is to be out-of-pocket and if the score is high he will need an ischemia evaluation. Well-controlled continue current treatment calcium channel blocker beta-blocker ACE inhibitor Continue his high intensity statin with diabetes Stable continue beta-blocker Well-controlled A1c at target continue metformin for second agent as needed GLP-1 agonist be appropriate   Next appointment: 1 year    Medication Adjustments/Labs and Tests Ordered: Current medicines are reviewed at length with the patient today.  Concerns regarding medicines are outlined above.  No orders of the defined types were placed in this encounter.  No orders of the defined types were placed in this encounter.   Chief Complaint  Patient presents with   Follow-up    he is concerned regarding his cardiac risk with a strong family history of premature CAD and a younger brother with recent CABG     History of Present Illness:    Russell Wolf is a 70 y.o. male with a hx of symptomatic PVCs hypertension and hyper lipidemia and strong family history of premature CAD last seen 01/08/2020. Compliance with diet, lifestyle and medications: Yes  Is a strong family  history of CAD father had bypass surgery late in life brother 8 years younger with CABG and another brother 66 years younger with recent CABG.  He has questions whether he needs further testing.  He is having no anginal discomfort is hypertension lipids are treated and after discussion Lequita Halt I do a cardiac calcium score.  If the score is high he need an ischemia evaluation CTA. He is having no chest pain edema shortness of breath or syncope he has occasional palpitation not severe or sustained  Recent labs [facility] 05/15/2020 shows an A1c of 7.0%. Lipid profile 03/05/2020 cholesterol 123 LDL 68 triglycerides 199 HDL 35 CMP at that time was normal except for sodium mildly diminished 134 creatinine 1.06 normal liver function test Past Medical History:  Diagnosis Date   Acute non-recurrent maxillary sinusitis 02/20/2017   Annual physical exam 03/10/2017   Bronchitis 02/16/2017   Essential hypertension 03/31/2015   Hyperlipidemia 03/31/2015   Mixed dyslipidemia 03/10/2017   PVC's (premature ventricular contractions) 03/31/2015   Type 2 diabetes mellitus with hyperglycemia, without long-term current use of insulin (HCC) 03/10/2017    Past Surgical History:  Procedure Laterality Date   SHOULDER SURGERY      Current Medications: Current Meds  Medication Sig   amLODipine-benazepril (LOTREL) 5-20 MG capsule TAKE 1 CAPSULE BY MOUTH ONCE (1) DAILY   aspirin EC 81 MG tablet Take by mouth daily.   carvedilol (COREG) 25 MG tablet TAKE 1 TABLET BY MOUTH TWICE (2) DAILY WITH A MEAL   Cholecalciferol (VITAMIN D3) 250  MCG (10000 UT) TABS Take 1 tablet by mouth daily.   loratadine (CLARITIN) 10 MG tablet Take by mouth.   metFORMIN (GLUCOPHAGE) 1000 MG tablet Take 1,000 mg by mouth 2 (two) times daily with a meal.    Multiple Vitamin (MULTI-VITAMINS) TABS Take by mouth.   Multiple Vitamins-Minerals (HAIR/SKIN/NAILS/BIOTIN) TABS Take 1 tablet by mouth daily.   Omega-3 Fatty Acids (FISH OIL) 1000 MG  CAPS Take 1 capsule by mouth daily.   rosuvastatin (CRESTOR) 20 MG tablet Take 1 tablet (20 mg total) by mouth daily.   terbinafine (LAMISIL) 250 MG tablet Take 250 mg by mouth daily.   vitamin C (ASCORBIC ACID) 500 MG tablet Take 500 mg by mouth daily.   Zinc 50 MG TABS Take 1 tablet by mouth daily.     Allergies:   Niacin and Levofloxacin   Social History   Socioeconomic History   Marital status: Married    Spouse name: Not on file   Number of children: Not on file   Years of education: Not on file   Highest education level: Not on file  Occupational History   Not on file  Tobacco Use   Smoking status: Never   Smokeless tobacco: Never  Substance and Sexual Activity   Alcohol use: Not on file   Drug use: Not on file   Sexual activity: Not on file  Other Topics Concern   Not on file  Social History Narrative   Not on file   Social Determinants of Health   Financial Resource Strain: Not on file  Food Insecurity: Not on file  Transportation Needs: Not on file  Physical Activity: Not on file  Stress: Not on file  Social Connections: Not on file     Family History: The patient's family history includes Breast cancer in his mother; CAD in his father; Heart disease in his brother and maternal uncle; Supraventricular tachycardia in his mother. ROS:   Please see the history of present illness.    All other systems reviewed and are negative.  EKGs/Labs/Other Studies Reviewed:    The following studies were reviewed today:  EKG:  EKG ordered today and personally reviewed.  The ekg ordered today demonstrates sinus rhythm normal no PVCs  Recent Labs: 01/02/2020: ALT 25; BUN 24; Creatinine, Ser 1.16; Potassium 4.7; Sodium 136  Recent Lipid Panel    Component Value Date/Time   CHOL 116 01/02/2020 0901   TRIG 204 (H) 01/02/2020 0901   HDL 28 (L) 01/02/2020 0901   CHOLHDL 4.1 01/02/2020 0901   LDLCALC 54 01/02/2020 0901    Physical Exam:    VS:  BP 118/70 (BP  Location: Right Arm, Patient Position: Sitting, Cuff Size: Normal)   Pulse 81   Ht 6\' 2"  (1.88 m)   Wt 196 lb 12.8 oz (89.3 kg)   SpO2 96%   BMI 25.27 kg/m     Wt Readings from Last 3 Encounters:  09/18/20 196 lb 12.8 oz (89.3 kg)  01/08/20 202 lb 12.8 oz (92 kg)  01/03/19 192 lb (87.1 kg)     GEN: No xanthoma or xanthelasma well nourished, well developed in no acute distress HEENT: Normal NECK: No JVD; No carotid bruits LYMPHATICS: No lymphadenopathy CARDIAC: RRR, no murmurs, rubs, gallops RESPIRATORY:  Clear to auscultation without rales, wheezing or rhonchi  ABDOMEN: Soft, non-tender, non-distended MUSCULOSKELETAL:  No edema; No deformity  SKIN: Warm and dry NEUROLOGIC:  Alert and oriented x 3 PSYCHIATRIC:  Normal affect    Signed, 01/05/19  Dulce Sellar, MD  09/18/2020 1:54 PM    [facility]

## 2020-10-08 ENCOUNTER — Telehealth: Payer: Self-pay | Admitting: Cardiology

## 2020-10-08 ENCOUNTER — Other Ambulatory Visit: Payer: Self-pay

## 2020-10-08 ENCOUNTER — Ambulatory Visit (INDEPENDENT_AMBULATORY_CARE_PROVIDER_SITE_OTHER)
Admission: RE | Admit: 2020-10-08 | Discharge: 2020-10-08 | Disposition: A | Discharge status: Home / Self Care | Payer: Self-pay | Source: Ambulatory Visit | Attending: Cardiology | Admitting: Cardiology

## 2020-10-08 ENCOUNTER — Telehealth: Payer: Self-pay

## 2020-10-08 DIAGNOSIS — I493 Ventricular premature depolarization: Secondary | ICD-10-CM

## 2020-10-08 DIAGNOSIS — I10 Essential (primary) hypertension: Secondary | ICD-10-CM

## 2020-10-08 DIAGNOSIS — R931 Abnormal findings on diagnostic imaging of heart and coronary circulation: Secondary | ICD-10-CM

## 2020-10-08 MED ORDER — METOPROLOL TARTRATE 100 MG PO TABS: 100.0000 mg | ORAL_TABLET | Freq: Once | ORAL | 0 refills | Status: DC

## 2020-10-08 NOTE — Telephone Encounter (Signed)
Pt is returning a call  

## 2020-10-08 NOTE — Telephone Encounter (Signed)
Left message on patients voicemail to please return our call.   

## 2020-10-08 NOTE — Telephone Encounter (Signed)
Spoke with patient regarding results and recommendation.  Patient verbalizes understanding and is agreeable to plan of care. Advised patient to call back with any issues or concerns.    The following instructions were reviewed with the patient and he verbalizes understanding.     Your cardiac CT will be scheduled at the below location:   [facility] [address] [place], [place] [address] [phone]  If scheduled at [facility], please arrive at the [facility] main entrance (entrance A) of [facility] 30 minutes prior to test start time. Proceed to the [facility] Radiology Department (first floor) to check-in and test prep.  Please follow these instructions carefully (unless otherwise directed):  Hold all erectile dysfunction medications at least 3 days (72 hrs) prior to test.  On the Night Before the Test: Be sure to Drink plenty of water. Do not consume any caffeinated/decaffeinated beverages or chocolate 12 hours prior to your test. Do not take any antihistamines 12 hours prior to your test.  On the Day of the Test: Drink plenty of water until 1 hour prior to the test. Do not eat any food 4 hours prior to the test. You may take your regular medications prior to the test.  Take metoprolol (Lopressor) two hours prior to test.  After the Test: Drink plenty of water. After receiving IV contrast, you may experience a mild flushed feeling. This is normal. On occasion, you may experience a mild rash up to 24 hours after the test. This is not dangerous. If this occurs, you can take Benadryl 25 mg and increase your fluid intake. If you experience trouble breathing, this can be serious. If it is severe call 911 IMMEDIATELY. If it is mild, please call our office. If you take any of these medications: Glipizide/Metformin, Avandament, Glucavance, please do not take 48 hours after completing test unless otherwise instructed.  Please allow 2-4 weeks for  scheduling of routine cardiac CTs. Some insurance companies require a pre-authorization which may delay scheduling of this test.   For non-scheduling related questions, please contact the cardiac imaging nurse navigator should you have any questions/concerns: Rockwell Alexandria, Cardiac Imaging Nurse Navigator Larey Brick, Cardiac Imaging Nurse Navigator [facility] Direct Office Dial: [phone]   For scheduling needs, including cancellations and rescheduling, please call [place], [phone].

## 2020-10-08 NOTE — Telephone Encounter (Signed)
-----   Message from Baldo Daub, MD sent at 10/08/2020  1:08 PM EDT ----- His calcium score is quite high, greater than 300  I would like him to go ahead and set up a cardiac CTA to define if he has obstruction of the arteries.  Indication elevated cardiac calcium score

## 2020-10-09 DIAGNOSIS — R931 Abnormal findings on diagnostic imaging of heart and coronary circulation: Secondary | ICD-10-CM | POA: Diagnosis not present

## 2020-10-09 LAB — BASIC METABOLIC PANEL
BUN/Creatinine Ratio: 18 (ref 10–24)
BUN: 21 mg/dL (ref 8–27)
CO2: 22 mmol/L (ref 20–29)
Calcium: 9.2 mg/dL (ref 8.6–10.2)
Chloride: 98 mmol/L (ref 96–106)
Creatinine, Ser: 1.18 mg/dL (ref 0.76–1.27)
Glucose: 116 mg/dL — ABNORMAL HIGH (ref 65–99)
Potassium: 4.6 mmol/L (ref 3.5–5.2)
Sodium: 137 mmol/L (ref 134–144)
eGFR: 67 mL/min/{1.73_m2} (ref >59)

## 2020-10-13 ENCOUNTER — Telehealth: Payer: Self-pay

## 2020-10-13 ENCOUNTER — Telehealth: Payer: Self-pay | Admitting: Cardiology

## 2020-10-13 NOTE — Telephone Encounter (Signed)
Pt is returning call.  

## 2020-10-13 NOTE — Telephone Encounter (Signed)
Left message on patients voicemail to please return our call.   

## 2020-10-13 NOTE — Telephone Encounter (Signed)
Patient called and said that he would like to get a copy of his test that was taken. Stated that wife works at [employer] and wants to know if she could come pick them up when she gets off work

## 2020-10-13 NOTE — Telephone Encounter (Signed)
-----   Message from Brian J Munley, MD sent at 10/11/2020 12:44 PM EDT ----- Normal or stable result 

## 2020-10-13 NOTE — Telephone Encounter (Signed)
Spoke to the patient just now and let him know that I can print off his CT report for him. He verbalizes understanding and thanks me for the call back.

## 2020-10-20 ENCOUNTER — Ambulatory Visit (HOSPITAL_COMMUNITY): Payer: PPO

## 2020-10-20 ENCOUNTER — Telehealth (HOSPITAL_COMMUNITY): Payer: Self-pay | Admitting: *Deleted

## 2020-10-20 NOTE — Telephone Encounter (Signed)
Reaching out to patient to offer assistance regarding upcoming cardiac imaging study; pt verbalizes understanding of appt date/time, parking situation and where to check in, pre-test NPO status and medications ordered, and verified current allergies; name and call back number provided for further questions should they arise ? ?Tyrena Gohr RN Navigator Cardiac Imaging ?[facility] ?[phone] office ?[phone] cell ? ?Patient to take 100mg metoprolol tartrate two hours prior cardiac CT scan. ?

## 2020-10-21 ENCOUNTER — Other Ambulatory Visit: Payer: Self-pay

## 2020-10-21 ENCOUNTER — Ambulatory Visit (HOSPITAL_COMMUNITY)
Admission: RE | Admit: 2020-10-21 | Discharge: 2020-10-21 | Disposition: A | Discharge status: Home / Self Care | Payer: PPO | Source: Ambulatory Visit | Attending: Cardiology | Admitting: Cardiology

## 2020-10-21 ENCOUNTER — Encounter (HOSPITAL_COMMUNITY): Payer: Self-pay

## 2020-10-21 DIAGNOSIS — I700 Atherosclerosis of aorta: Secondary | ICD-10-CM | POA: Diagnosis not present

## 2020-10-21 DIAGNOSIS — R931 Abnormal findings on diagnostic imaging of heart and coronary circulation: Secondary | ICD-10-CM | POA: Diagnosis not present

## 2020-10-21 DIAGNOSIS — I2510 Atherosclerotic heart disease of native coronary artery without angina pectoris: Secondary | ICD-10-CM | POA: Diagnosis not present

## 2020-10-21 MED ORDER — NITROGLYCERIN 0.4 MG SL SUBL
0.8000 mg | SUBLINGUAL_TABLET | Freq: Once | SUBLINGUAL | Status: AC
  Administered 2020-10-21: 0.8 mg via SUBLINGUAL

## 2020-10-21 MED ORDER — NITROGLYCERIN 0.4 MG SL SUBL
SUBLINGUAL_TABLET | SUBLINGUAL | Status: AC
  Filled 2020-10-21: qty 2

## 2020-10-21 MED ORDER — IOHEXOL 350 MG/ML SOLN
95.0000 mL | Freq: Once | INTRAVENOUS | Status: AC | PRN
  Administered 2020-10-21: 95 mL via INTRAVENOUS

## 2020-10-23 ENCOUNTER — Telehealth: Payer: Self-pay

## 2020-10-23 NOTE — Telephone Encounter (Signed)
-----   Message from Baldo Daub, MD sent at 10/22/2020  7:38 PM EDT ----- His calcium score is very high despite taking rosuvastatin  I would like to see his LDL cholesterol less than 50 and I think he do best to add Repatha 140 mg twice daily as he has mild CAD.  There is no indication at this time he needs to undergo heart catheterization.  I can discuss further at his office follow-up.

## 2020-10-23 NOTE — Telephone Encounter (Signed)
Patient notified of results and he informed me he has not been on rosuvastatin in about a month. He is trying diet and cleansing remedies. He want to stay off as many meds as possible. With that being said does the patient need to proceed with Repatha?. Message sent to Dr. Tomie China for further instructions.

## 2020-10-28 ENCOUNTER — Telehealth: Payer: Self-pay | Admitting: Cardiology

## 2020-10-28 DIAGNOSIS — R931 Abnormal findings on diagnostic imaging of heart and coronary circulation: Secondary | ICD-10-CM

## 2020-10-28 DIAGNOSIS — E782 Mixed hyperlipidemia: Secondary | ICD-10-CM

## 2020-10-28 DIAGNOSIS — I10 Essential (primary) hypertension: Secondary | ICD-10-CM

## 2020-10-28 NOTE — Telephone Encounter (Signed)
Spoke with the patient advise of the following per Dr. Dulce Sellar. Patient is agreeable with plan. Will stop by in 3 weeks per his request. Future order in chart.

## 2020-10-28 NOTE — Telephone Encounter (Signed)
Russell Wolf is returning Russell Wolf's call about his CT results. Please advise.

## 2020-10-28 NOTE — Telephone Encounter (Signed)
-----   Message from Baldo Daub, MD sent at 10/28/2020 12:04 PM EDT ----- Good idea lets bring him in and do a fasting lipid profile ----- Message ----- From: Heywood Bene, CMA Sent: 10/28/2020  11:38 AM EDT To: Baldo Daub, MD  Patient is asking before we start on Repatha, can blood work be order in the next couple of weeks to see if his diet change has improved LDL. If not improvement he may decide to follow your recommendations. Please advise if ok to place orders.  ----- Message ----- From: Baldo Daub, MD Sent: 10/26/2020   2:49 PM EDT To: Tiburcio Pea Cambelle Suchecki, CMA  I can understand his reluctance but I think in his case he should take both and he truly needs to have an ideal LDL of less than 50. ----- Message ----- From: Heywood Bene, CMA Sent: 10/26/2020   1:54 PM EDT To: Baldo Daub, MD  I notified the patient of results and recommendations. Patient stated the following: Patient notified of results and he informed me he has not been on rosuvastatin in about a month. He is trying diet and cleansing remedies. He want to stay off as many meds as possible. With that being said does the patient need to proceed with Repatha?  I message Dr. Tomie China last week when you where off and and felt it would be best to forward this message when you return. Please advise ----- Message ----- From: Garwin Brothers, MD Sent: 10/23/2020   9:35 AM EDT To: Tiburcio Pea Gifford Ballon, CMA  Best to run it by Dr. Dulce Sellar when he comes back next week as the patient is not keen on any lipid-lowering medications.  I do not see any urgency for this. ----- Message ----- From: Heywood Bene, CMA Sent: 10/23/2020   8:30 AM EDT To: Garwin Brothers, MD  Patient notified of results and he informed me he has not been on rosuvastatin in about a month. He is trying diet and cleansing remedies. He want to stay off as many meds as possible. With that being said does the patient need to proceed with  Repatha?. Please advise in Dr. Hulen Shouts absence  ----- Message ----- From: Baldo Daub, MD Sent: 10/22/2020   7:38 PM EDT To: Mickie Bail Ash/Hp Triage  His calcium score is very high despite taking rosuvastatin  I would like to see his LDL cholesterol less than 50 and I think he do best to add Repatha 140 mg twice daily as he has mild CAD.  There is no indication at this time he needs to undergo heart catheterization.  I can discuss further at his office follow-up.

## 2020-11-18 ENCOUNTER — Other Ambulatory Visit: Payer: Self-pay

## 2020-11-18 DIAGNOSIS — I10 Essential (primary) hypertension: Secondary | ICD-10-CM

## 2020-11-18 DIAGNOSIS — R931 Abnormal findings on diagnostic imaging of heart and coronary circulation: Secondary | ICD-10-CM | POA: Diagnosis not present

## 2020-11-18 DIAGNOSIS — E782 Mixed hyperlipidemia: Secondary | ICD-10-CM

## 2020-11-19 ENCOUNTER — Telehealth: Payer: Self-pay

## 2020-11-19 LAB — LIPID PANEL
Chol/HDL Ratio: 4.8 ratio (ref 0.0–5.0)
Cholesterol, Total: 163 mg/dL (ref 100–199)
HDL: 34 mg/dL — ABNORMAL LOW (ref >39)
LDL Chol Calc (NIH): 107 mg/dL — ABNORMAL HIGH (ref 0–99)
Triglycerides: 119 mg/dL (ref 0–149)
VLDL Cholesterol Cal: 22 mg/dL (ref 5–40)

## 2020-11-19 NOTE — Telephone Encounter (Signed)
Left message on patients voicemail to please return our call.   

## 2020-11-19 NOTE — Telephone Encounter (Signed)
Spoke with patient regarding results and recommendation.  Patient verbalizes understanding and tells me that he will think about the Repatha and will look into it before making a decision. He also tells me that he has not been taking his crestor as he was hoping his diet and exercise would have helped with this. I told him to resume taking this and he states that he will.

## 2020-11-19 NOTE — Telephone Encounter (Signed)
-----   Message from Baldo Daub, MD sent at 11/19/2020  7:50 AM EDT ----- His LDL was significantly elevated I think he would benefit from combined treatment with a statin and PCSK9 inhibitor we will start Repatha 140 mg every 2 weeks follow-up lipid profile 2 months

## 2020-11-19 NOTE — Telephone Encounter (Signed)
-----   Message from Brian J Munley, MD sent at 11/19/2020  7:50 AM EDT ----- His LDL was significantly elevated I think he would benefit from combined treatment with a statin and PCSK9 inhibitor we will start Repatha 140 mg every 2 weeks follow-up lipid profile 2 months 

## 2021-01-15 ENCOUNTER — Other Ambulatory Visit: Payer: Self-pay

## 2021-01-15 DIAGNOSIS — I10 Essential (primary) hypertension: Secondary | ICD-10-CM

## 2021-01-15 DIAGNOSIS — E7800 Pure hypercholesterolemia, unspecified: Secondary | ICD-10-CM | POA: Diagnosis not present

## 2021-01-15 DIAGNOSIS — E782 Mixed hyperlipidemia: Secondary | ICD-10-CM

## 2021-01-15 LAB — COMPREHENSIVE METABOLIC PANEL
ALT: 18 IU/L (ref 0–44)
AST: 25 IU/L (ref 0–40)
Albumin/Globulin Ratio: 1.7 (ref 1.2–2.2)
Albumin: 4.5 g/dL (ref 3.8–4.8)
Alkaline Phosphatase: 64 IU/L (ref 44–121)
BUN/Creatinine Ratio: 19 (ref 10–24)
BUN: 20 mg/dL (ref 8–27)
Bilirubin Total: 0.6 mg/dL (ref 0.0–1.2)
CO2: 25 mmol/L (ref 20–29)
Calcium: 9.5 mg/dL (ref 8.6–10.2)
Chloride: 102 mmol/L (ref 96–106)
Creatinine, Ser: 1.04 mg/dL (ref 0.76–1.27)
Globulin, Total: 2.6 g/dL (ref 1.5–4.5)
Glucose: 88 mg/dL (ref 70–99)
Potassium: 4.8 mmol/L (ref 3.5–5.2)
Sodium: 141 mmol/L (ref 134–144)
Total Protein: 7.1 g/dL (ref 6.0–8.5)
eGFR: 78 mL/min/{1.73_m2} (ref >59)

## 2021-01-15 LAB — LIPID PANEL
Chol/HDL Ratio: 4.5 ratio (ref 0.0–5.0)
Cholesterol, Total: 171 mg/dL (ref 100–199)
HDL: 38 mg/dL — ABNORMAL LOW (ref >39)
LDL Chol Calc (NIH): 116 mg/dL — ABNORMAL HIGH (ref 0–99)
Triglycerides: 93 mg/dL (ref 0–149)
VLDL Cholesterol Cal: 17 mg/dL (ref 5–40)

## 2021-01-18 ENCOUNTER — Telehealth: Payer: Self-pay

## 2021-01-18 NOTE — Telephone Encounter (Signed)
Left message on patients voicemail to please return our call.   

## 2021-01-18 NOTE — Telephone Encounter (Signed)
-----   Message from Baldo Daub, MD sent at 01/18/2021  8:54 AM EST ----- For annual goal LDL is closer to 55 I think he should start Repatha and remain on a statin

## 2021-01-19 ENCOUNTER — Telehealth: Payer: Self-pay

## 2021-01-19 NOTE — Telephone Encounter (Signed)
Left message on patients voicemail to please return our call.   

## 2021-01-19 NOTE — Telephone Encounter (Signed)
-----   Message from Baldo Daub, MD sent at 01/18/2021  8:54 AM EST ----- For annual goal LDL is closer to 55 I think he should start Repatha and remain on a statin

## 2021-01-21 ENCOUNTER — Telehealth: Payer: Self-pay

## 2021-01-21 ENCOUNTER — Encounter: Payer: Self-pay | Admitting: Cardiology

## 2021-01-21 ENCOUNTER — Ambulatory Visit: Payer: PPO | Admitting: Cardiology

## 2021-01-21 ENCOUNTER — Other Ambulatory Visit: Payer: Self-pay

## 2021-01-21 VITALS — BP 122/68 | HR 76 | Ht 74.0 in | Wt 177.6 lb

## 2021-01-21 DIAGNOSIS — E1165 Type 2 diabetes mellitus with hyperglycemia: Secondary | ICD-10-CM | POA: Diagnosis not present

## 2021-01-21 DIAGNOSIS — R931 Abnormal findings on diagnostic imaging of heart and coronary circulation: Secondary | ICD-10-CM

## 2021-01-21 DIAGNOSIS — I10 Essential (primary) hypertension: Secondary | ICD-10-CM | POA: Diagnosis not present

## 2021-01-21 DIAGNOSIS — I2510 Atherosclerotic heart disease of native coronary artery without angina pectoris: Secondary | ICD-10-CM

## 2021-01-21 DIAGNOSIS — E7800 Pure hypercholesterolemia, unspecified: Secondary | ICD-10-CM | POA: Diagnosis not present

## 2021-01-21 MED ORDER — REPATHA SURECLICK 140 MG/ML ~~LOC~~ SOAJ: 140.0000 mg | SUBCUTANEOUS | 3 refills | Status: DC

## 2021-01-21 NOTE — Patient Instructions (Signed)
Medication Instructions:  Your physician recommends that you continue on your current medications as directed. Please refer to the Current Medication list given to you today.  *If you need a refill on your cardiac medications before your next appointment, please call your pharmacy*   Lab Work: Your physician recommends that you return for lab work in: 6 WEEKS Lipids, LPa If you have labs (blood work) drawn today and your tests are completely normal, you will receive your results only by: MyChart Message (if you have MyChart) OR A paper copy in the mail If you have any lab test that is abnormal or we need to change your treatment, we will call you to review the results.   Testing/Procedures: None   Follow-Up: At [facility], you and your health needs are our priority.  As part of our continuing mission to provide you with exceptional heart care, we have created designated Provider Care Teams.  These Care Teams include your primary Cardiologist (physician) and Advanced Practice Providers (APPs -  Physician Assistants and Nurse Practitioners) who all work together to provide you with the care you need, when you need it.  We recommend signing up for the patient portal called "MyChart".  Sign up information is provided on this After Visit Summary.  MyChart is used to connect with patients for Virtual Visits (Telemedicine).  Patients are able to view lab/test results, encounter notes, upcoming appointments, etc.  Non-urgent messages can be sent to your provider as well.   To learn more about what you can do with MyChart, go to [email or web address].    Your next appointment:   6 month(s)  The format for your next appointment:   In Person  Provider:   Norman Herrlich, MD    Other Instructions

## 2021-01-21 NOTE — Progress Notes (Signed)
Cardiology Office Note:    Date:  01/21/2021   ID:  Russell Wolf, DOB Jun 02, 1950, MRN [id number]  PCP:  Hadley Pen, MD  Cardiologist:  Norman Herrlich, MD    Referring MD: Hadley Pen, MD    ASSESSMENT:    1. Agatston coronary artery calcium score greater than 400   2. Essential hypertension   3. Type 2 diabetes mellitus with hyperglycemia, without long-term current use of insulin (HCC)   4. Elevated coronary artery calcium score   5. Coronary artery disease involving native coronary artery of native heart without angina pectoris   6. Pure hypercholesterolemia    PLAN:    In order of problems listed above:  He agrees to nonstatin therapy initiate Repatha 6 weeks recheck lipids goal LDL less than 55 likely will need a second nonstatin agent and screen for LP(a). Stable at target continue treatment including calcium channel blocker ACE inhibitor Improved blood sugars are normal with diet weight loss and activity off metformin Initiate Repatha   Next appointment: 3 months   Medication Adjustments/Labs and Tests Ordered: Current medicines are reviewed at length with the patient today.  Concerns regarding medicines are outlined above.  Orders Placed This Encounter  Procedures   Lipoprotein A (LPA)   Lipid panel    No orders of the defined types were placed in this encounter.   Chief Complaint  Patient presents with   Follow-up   Hyperlipidemia   Coronary Artery Disease   History of Present Illness:    Russell Wolf is a 70 y.o. male with a hx of symptomatic PVCs hypertension hyperlipidemia and a very strong family history of premature CAD last seen 09/18/2020.  He had a coronary calcium score performed 10/08/2020 extremely elevated 959 87 percentile for age and sex.  Because of his very elevated coronary calcium score and multiple cardiovascular risk he underwent coronary CTA 10/21/2020.  Left main coronary artery was normal he had 40 to 59% stenosis  proximal mid LAD 1 to 24% proximal mid left circumflex and 40 to 59% proximal right coronary artery.  The aortic root was normal in size.  He has severe dyslipidemia with residual LDL elevation on statin.  I have advised coincident PCSK9 therapy.  He has not been screened for LP(a).   Compliance with diet, lifestyle and medications: Yes  I reviewed his testing with him. He has done great things in his life he is lost 40 pounds his blood sugar now without metformin is less than 125 and he feels better. Unfortunately has neuropathy was told it was due to a statin he stopped his rosuvastatin after discussion of his high calcium score CAD and personal risk he agrees to start PCSK9 inhibitor he may need additional therapy with a nonstatin like Zetia and will screen for LP(a) next visit He remains employed and is not having chest pain shortness of breath palpitation or syncope.  Additional  Component Ref Range & Units 6 d ago 2 mo ago 1 yr ago 2 yr ago  Cholesterol, Total 100 - 199 mg/dL 237  628  315  176   Triglycerides 0 - 149 mg/dL 93  160  737 High   106 High    HDL >39 mg/dL 38 Low   34 Low   28 Low   35 Low    VLDL Cholesterol Cal 5 - 40 mg/dL 17  22  34  31   LDL Chol Calc (NIH) 0 - 99 mg/dL 269 High  107 High   54  69   Chol/HDL Ratio 0.0 - 5.0 ratio 4.5  4.8 CM  4.1 CM  3.9    Past Medical History:  Diagnosis Date   Acute non-recurrent maxillary sinusitis 02/20/2017   Annual physical exam 03/10/2017   Bronchitis 02/16/2017   Essential hypertension 03/31/2015   Hyperlipidemia 03/31/2015   Mixed dyslipidemia 03/10/2017   PVC's (premature ventricular contractions) 03/31/2015   Type 2 diabetes mellitus with hyperglycemia, without long-term current use of insulin (HCC) 03/10/2017    Past Surgical History:  Procedure Laterality Date   SHOULDER SURGERY      Current Medications: Current Meds  Medication Sig   amLODipine-benazepril (LOTREL) 5-20 MG capsule TAKE 1 CAPSULE BY MOUTH ONCE (1)  DAILY   aspirin EC 81 MG tablet Take by mouth daily.   carvedilol (COREG) 25 MG tablet TAKE 1 TABLET BY MOUTH TWICE (2) DAILY WITH A MEAL   Cholecalciferol (VITAMIN D3) 250 MCG (10000 UT) TABS Take 1 tablet by mouth daily.   Evolocumab (REPATHA SURECLICK) 140 MG/ML SOAJ Inject 140 mg into the skin every 14 (fourteen) days.   loratadine (CLARITIN) 10 MG tablet Take by mouth.   metoprolol tartrate (LOPRESSOR) 100 MG tablet Take 1 tablet (100 mg total) by mouth once for 1 dose. Take two hours prior to your cardiac CT   Multiple Vitamin (MULTI-VITAMINS) TABS Take by mouth.   Multiple Vitamins-Minerals (HAIR/SKIN/NAILS/BIOTIN) TABS Take 1 tablet by mouth daily.   Omega-3 Fatty Acids (FISH OIL) 1000 MG CAPS Take 1 capsule by mouth daily.   terbinafine (LAMISIL) 250 MG tablet Take 250 mg by mouth daily.   vitamin C (ASCORBIC ACID) 500 MG tablet Take 500 mg by mouth daily.   Zinc 50 MG TABS Take 1 tablet by mouth daily.     Allergies:   Niacin and Levofloxacin   Social History   Socioeconomic History   Marital status: Married    Spouse name: Not on file   Number of children: Not on file   Years of education: Not on file   Highest education level: Not on file  Occupational History   Not on file  Tobacco Use   Smoking status: Never   Smokeless tobacco: Never  Substance and Sexual Activity   Alcohol use: Not on file   Drug use: Not on file   Sexual activity: Not on file  Other Topics Concern   Not on file  Social History Narrative   Not on file   Social Determinants of Health   Financial Resource Strain: Not on file  Food Insecurity: Not on file  Transportation Needs: Not on file  Physical Activity: Not on file  Stress: Not on file  Social Connections: Not on file     Family History: The patient's family history includes Breast cancer in his mother; CAD in his father; Heart disease in his brother and maternal uncle; Supraventricular tachycardia in his mother. ROS:   Please  see the history of present illness.    All other systems reviewed and are negative.  EKGs/Labs/Other Studies Reviewed:    The following studies were reviewed today:    Recent Labs: 01/15/2021: ALT 18; BUN 20; Creatinine, Ser 1.04; Potassium 4.8; Sodium 141  Recent Lipid Panel    Component Value Date/Time   CHOL 171 01/15/2021 0821   TRIG 93 01/15/2021 0821   HDL 38 (L) 01/15/2021 0821   CHOLHDL 4.5 01/15/2021 0821   LDLCALC 116 (H) 01/15/2021 0821    Physical  Exam:    VS:  BP 122/68 (BP Location: Right Arm, Patient Position: Sitting, Cuff Size: Normal)   Pulse 76   Ht 6\' 2"  (1.88 m)   Wt 177 lb 9.6 oz (80.6 kg)   SpO2 98%   BMI 22.80 kg/m     Wt Readings from Last 3 Encounters:  01/21/21 177 lb 9.6 oz (80.6 kg)  09/18/20 196 lb 12.8 oz (89.3 kg)  01/08/20 202 lb 12.8 oz (92 kg)     GEN:  Well nourished, well developed in no acute distress HEENT: Normal NECK: No JVD; No carotid bruits LYMPHATICS: No lymphadenopathy CARDIAC: RRR, no murmurs, rubs, gallops RESPIRATORY:  Clear to auscultation without rales, wheezing or rhonchi  ABDOMEN: Soft, non-tender, non-distended MUSCULOSKELETAL:  No edema; No deformity  SKIN: Warm and dry NEUROLOGIC:  Alert and oriented x 3 PSYCHIATRIC:  Normal affect    Signed, 13/03/21, MD  01/21/2021 11:32 AM    [facility]

## 2021-01-21 NOTE — Telephone Encounter (Signed)
Spoke with patients wife regarding results and recommendation.  Patient verbalizes understanding and is agreeable to plan of care. Advised patient to call back with any issues or concerns.   

## 2021-01-21 NOTE — Telephone Encounter (Signed)
-----   Message from Baldo Daub, MD sent at 01/18/2021  8:54 AM EST ----- For annual goal LDL is closer to 55 I think he should start Repatha and remain on a statin

## 2021-01-25 ENCOUNTER — Telehealth: Payer: Self-pay

## 2021-01-25 NOTE — Telephone Encounter (Signed)
Prior Auth initiated for Repatha.  Confirmation: Key: [id number]

## 2021-02-02 NOTE — Telephone Encounter (Signed)
PA Case: [id number], Status: Approved, Coverage Starts on: 01/25/2021 12:00:00 AM, Coverage Ends on: 01/25/2022 12:00:00 AM.

## 2021-07-20 ENCOUNTER — Telehealth: Payer: Self-pay | Admitting: *Deleted

## 2021-07-20 ENCOUNTER — Other Ambulatory Visit: Payer: Self-pay | Admitting: *Deleted

## 2021-07-20 DIAGNOSIS — R931 Abnormal findings on diagnostic imaging of heart and coronary circulation: Secondary | ICD-10-CM

## 2021-07-20 DIAGNOSIS — I10 Essential (primary) hypertension: Secondary | ICD-10-CM

## 2021-07-20 DIAGNOSIS — E1165 Type 2 diabetes mellitus with hyperglycemia: Secondary | ICD-10-CM

## 2021-07-20 DIAGNOSIS — I2510 Atherosclerotic heart disease of native coronary artery without angina pectoris: Secondary | ICD-10-CM

## 2021-07-20 DIAGNOSIS — E7800 Pure hypercholesterolemia, unspecified: Secondary | ICD-10-CM

## 2021-07-20 NOTE — Telephone Encounter (Signed)
Pt walked in to get labs done before his appt on Thursday with Dr Dulce Sellar. Need CMP, Lipid and LPa drawn per [facility] ?

## 2021-07-21 LAB — COMPREHENSIVE METABOLIC PANEL
ALT: 25 IU/L (ref 0–44)
AST: 24 IU/L (ref 0–40)
Albumin/Globulin Ratio: 1.6 (ref 1.2–2.2)
Albumin: 4.6 g/dL (ref 3.8–4.8)
Alkaline Phosphatase: 71 IU/L (ref 44–121)
BUN/Creatinine Ratio: 21 (ref 10–24)
BUN: 22 mg/dL (ref 8–27)
Bilirubin Total: 0.3 mg/dL (ref 0.0–1.2)
CO2: 24 mmol/L (ref 20–29)
Calcium: 10.1 mg/dL (ref 8.6–10.2)
Chloride: 100 mmol/L (ref 96–106)
Creatinine, Ser: 1.06 mg/dL (ref 0.76–1.27)
Globulin, Total: 2.9 g/dL (ref 1.5–4.5)
Glucose: 113 mg/dL — ABNORMAL HIGH (ref 70–99)
Potassium: 4.9 mmol/L (ref 3.5–5.2)
Sodium: 138 mmol/L (ref 134–144)
Total Protein: 7.5 g/dL (ref 6.0–8.5)
eGFR: 76 mL/min/{1.73_m2} (ref >59)

## 2021-07-21 LAB — LIPID PANEL
Chol/HDL Ratio: 5.3 ratio — ABNORMAL HIGH (ref 0.0–5.0)
Cholesterol, Total: 164 mg/dL (ref 100–199)
HDL: 31 mg/dL — ABNORMAL LOW (ref >39)
LDL Chol Calc (NIH): 102 mg/dL — ABNORMAL HIGH (ref 0–99)
Triglycerides: 174 mg/dL — ABNORMAL HIGH (ref 0–149)
VLDL Cholesterol Cal: 31 mg/dL (ref 5–40)

## 2021-07-21 LAB — LIPOPROTEIN A (LPA): Lipoprotein (a): <8.4 nmol/L (ref <75.0)

## 2021-07-22 ENCOUNTER — Encounter: Payer: Self-pay | Admitting: Cardiology

## 2021-07-22 ENCOUNTER — Telehealth: Payer: Self-pay

## 2021-07-22 ENCOUNTER — Ambulatory Visit: Payer: PPO | Admitting: Cardiology

## 2021-07-22 VITALS — BP 132/64 | HR 68 | Ht 74.0 in | Wt 184.8 lb

## 2021-07-22 DIAGNOSIS — I2510 Atherosclerotic heart disease of native coronary artery without angina pectoris: Secondary | ICD-10-CM

## 2021-07-22 DIAGNOSIS — R931 Abnormal findings on diagnostic imaging of heart and coronary circulation: Secondary | ICD-10-CM

## 2021-07-22 DIAGNOSIS — E7800 Pure hypercholesterolemia, unspecified: Secondary | ICD-10-CM | POA: Diagnosis not present

## 2021-07-22 DIAGNOSIS — E1165 Type 2 diabetes mellitus with hyperglycemia: Secondary | ICD-10-CM

## 2021-07-22 DIAGNOSIS — I10 Essential (primary) hypertension: Secondary | ICD-10-CM

## 2021-07-22 MED ORDER — NEXLIZET 180-10 MG PO TABS: ORAL_TABLET | ORAL | 3 refills | Status: DC

## 2021-07-22 NOTE — Patient Instructions (Signed)
Medication Instructions:  Your physician has recommended you make the following change in your medication:   START: Nexlizet 180-10 mg  daily  *If you need a refill on your cardiac medications before your next appointment, please call your pharmacy*   Lab Work: Your physician recommends that you return for lab work in:   Labs in 6 weeks: CMP, Lipids  If you have labs (blood work) drawn today and your tests are completely normal, you will receive your results only by: [place] (if you have [place]) OR A paper copy in the mail If you have any lab test that is abnormal or we need to change your treatment, we will call you to review the results.   Testing/Procedures: None   Follow-Up: At [facility], you and your health needs are our priority.  As part of our continuing mission to provide you with exceptional heart care, we have created designated Provider Care Teams.  These Care Teams include your primary Cardiologist (physician) and Advanced Practice Providers (APPs -  Physician Assistants and Nurse Practitioners) who all work together to provide you with the care you need, when you need it.  We recommend signing up for the patient portal called "MyChart".  Sign up information is provided on this After Visit Summary.  MyChart is used to connect with patients for Virtual Visits (Telemedicine).  Patients are able to view lab/test results, encounter notes, upcoming appointments, etc.  Non-urgent messages can be sent to your provider as well.   To learn more about what you can do with MyChart, go to [email or web address].    Your next appointment:   6 month(s)  The format for your next appointment:   In Person  Provider:   Shirlee More, MD    Other Instructions None  Important Information About Sugar

## 2021-07-22 NOTE — Telephone Encounter (Signed)
Prior authorization started for Nexlizet 180-10 mg through CoverMyMeds Key: [id number] - PA Case ID: [id number] - Rx #: [id number] Status Sent to [place] today Drug Nexlizet 180-10MG  tablets

## 2021-07-22 NOTE — Addendum Note (Signed)
Addended by: Darius Bump B on: 07/22/2021 11:45 AM   Modules accepted: Orders

## 2021-07-22 NOTE — Progress Notes (Signed)
Cardiology Office Note:    Date:  07/22/2021   ID:  Russell Wolf, DOB 01/07/1951, MRN [id number]  PCP:  Hadley Pen, MD  Cardiologist:  Norman Herrlich, MD    Referring MD: Hadley Pen, MD    ASSESSMENT:    1. Pure hypercholesterolemia   2. Coronary artery disease involving native coronary artery of native heart without angina pectoris   3. Agatston coronary artery calcium score greater than 400   4. Essential hypertension   5. Type 2 diabetes mellitus with hyperglycemia, without long-term current use of insulin (HCC)    PLAN:    In order of problems listed above:  High risk group initiate treatment if not tolerated I will refer him to the lipid clinic Stable having no angina Well-controlled continue combination beta-blocker ARB ACE inhibitor Currently well controlled continue treatment metformin   Next appointment: 6 months and visit with me for th   Medication Adjustments/Labs and Tests Ordered: Current medicines are reviewed at length with the patient today.  Concerns regarding medicines are outlined above.  No orders of the defined types were placed in this encounter.  No orders of the defined types were placed in this encounter.  Chief complaint follow-up for hyperlipidemia History of Present Illness:    Russell Wolf is a 71 y.o. male with a hx of hypertension hyperlipidemia strong family history of premature CAD and symptomatic PVCs along with an elevated coronary artery calcium score of 959/87th percentile.  Subsequent cardiac CTA showed moderate CAD with 40 to 59% stenosis mid LAD 40 to 59% stenosis of proximal right coronary artery.  He was last seen 01/21/2021 I encouraged him to accept PCSK9 therapy he declined.  Screening for LP(a) was normal.  Compliance with diet, lifestyle and medications: Yes  Russell Wolf is very focused on his diabetes and does not want to expose himself to the risk of worsened A1c and will not take a PCSK9 inhibitor We reviewed  options his LP(a) is normal He chose bempedoic acid Zetia goal history of an LDL less than 55 Recheck his lipid profile CMP in 6 weeks if tolerated He has no angina edema shortness of breath Past Medical History:  Diagnosis Date   Acute non-recurrent maxillary sinusitis 02/20/2017   Annual physical exam 03/10/2017   Bronchitis 02/16/2017   Essential hypertension 03/31/2015   Hyperlipidemia 03/31/2015   Mixed dyslipidemia 03/10/2017   PVC's (premature ventricular contractions) 03/31/2015   Type 2 diabetes mellitus with hyperglycemia, without long-term current use of insulin (HCC) 03/10/2017    Past Surgical History:  Procedure Laterality Date   SHOULDER SURGERY      Current Medications: Current Meds  Medication Sig   amLODipine-benazepril (LOTREL) 5-20 MG capsule TAKE 1 CAPSULE BY MOUTH ONCE (1) DAILY   aspirin EC 81 MG tablet Take by mouth daily.   carvedilol (COREG) 25 MG tablet TAKE 1 TABLET BY MOUTH TWICE (2) DAILY WITH A MEAL   Cholecalciferol (VITAMIN D3) 250 MCG (10000 UT) TABS Take 1 tablet by mouth daily.   Flaxseed, Linseed, (FLAX SEED OIL PO) Take by mouth daily.   KRILL OIL PO Take 1 capsule by mouth daily.   loratadine (CLARITIN) 10 MG tablet Take by mouth.   Multiple Vitamin (MULTI-VITAMINS) TABS Take by mouth.   Multiple Vitamins-Minerals (HAIR/SKIN/NAILS/BIOTIN) TABS Take 1 tablet by mouth daily.   Omega-3 Fatty Acids (FISH OIL) 1000 MG CAPS Take 1 capsule by mouth daily.   OVER THE COUNTER MEDICATION 1 tablet daily. Healthy Feet & Nerves  terbinafine (LAMISIL) 250 MG tablet Take 250 mg by mouth daily.   vitamin C (ASCORBIC ACID) 500 MG tablet Take 500 mg by mouth daily.   VITAMIN K PO Take 1 tablet by mouth daily.   Zinc 50 MG TABS Take 1 tablet by mouth daily.     Allergies:   Niacin and Levofloxacin   Social History   Socioeconomic History   Marital status: Married    Spouse name: Not on file   Number of children: Not on file   Years of education: Not on  file   Highest education level: Not on file  Occupational History   Not on file  Tobacco Use   Smoking status: Never   Smokeless tobacco: Never  Substance and Sexual Activity   Alcohol use: Not on file   Drug use: Not on file   Sexual activity: Not on file  Other Topics Concern   Not on file  Social History Narrative   Not on file   Social Determinants of Health   Financial Resource Strain: Not on file  Food Insecurity: Not on file  Transportation Needs: Not on file  Physical Activity: Not on file  Stress: Not on file  Social Connections: Not on file     Family History: The patient's family history includes Breast cancer in his mother; CAD in his father; Heart disease in his brother and maternal uncle; Supraventricular tachycardia in his mother. ROS:   Please see the history of present illness.    All other systems reviewed and are negative.  EKGs/Labs/Other Studies Reviewed:    The following studies were reviewed today:  Recent Labs: 07/20/2021: ALT 25; BUN 22; Creatinine, Ser 1.06; Potassium 4.9; Sodium 138  Recent Lipid Panel    Component Value Date/Time   CHOL 164 07/20/2021 0931   TRIG 174 (H) 07/20/2021 0931   HDL 31 (L) 07/20/2021 0931   CHOLHDL 5.3 (H) 07/20/2021 0931   LDLCALC 102 (H) 07/20/2021 0931    Physical Exam:    VS:  BP 132/64 (BP Location: Left Arm)   Pulse 68   Ht 6\' 2"  (1.88 m)   Wt 184 lb 12.8 oz (83.8 kg)   SpO2 99%   BMI 23.73 kg/m     Wt Readings from Last 3 Encounters:  07/22/21 184 lb 12.8 oz (83.8 kg)  01/21/21 177 lb 9.6 oz (80.6 kg)  09/18/20 196 lb 12.8 oz (89.3 kg)     GEN:  Well nourished, well developed in no acute distress HEENT: Normal NECK: No JVD; No carotid bruits LYMPHATICS: No lymphadenopathy CARDIAC: RRR, no murmurs, rubs, gallops RESPIRATORY:  Clear to auscultation without rales, wheezing or rhonchi  ABDOMEN: Soft, non-tender, non-distended MUSCULOSKELETAL:  No edema; No deformity  SKIN: Warm and  dry NEUROLOGIC:  Alert and oriented x 3 PSYCHIATRIC:  Normal affect    Signed, 09/20/20, MD  07/22/2021 9:24 AM    [facility]

## 2021-07-23 ENCOUNTER — Telehealth: Payer: Self-pay

## 2021-07-23 DIAGNOSIS — I2510 Atherosclerotic heart disease of native coronary artery without angina pectoris: Secondary | ICD-10-CM

## 2021-07-23 DIAGNOSIS — R931 Abnormal findings on diagnostic imaging of heart and coronary circulation: Secondary | ICD-10-CM

## 2021-07-23 DIAGNOSIS — E7800 Pure hypercholesterolemia, unspecified: Secondary | ICD-10-CM

## 2021-07-23 NOTE — Telephone Encounter (Signed)
Denial received from [place] for Nexlizet . Will forward to Dr Bettina Gavia for advise

## 2021-07-28 NOTE — Telephone Encounter (Signed)
Called patient back on mobil number and was able to talk with him. Informed him of the denial from his insurance for the Nexlitet. Patient states that he received a letter from his insurance to notify him as well. I also informed patient that Dr Dulce Sellar recommends referring him to the Lipid clinic and that they would contact him to get him scheduled with their clinic. Patient voices understanding and agrees

## 2021-07-28 NOTE — Telephone Encounter (Signed)
Tried calling patient with no answer and no voicemail available to leave a message. Will try again later time to reach patient regarding referral to lipid clinic

## 2021-08-17 ENCOUNTER — Other Ambulatory Visit: Payer: Self-pay

## 2021-08-17 MED ORDER — AMLODIPINE BESY-BENAZEPRIL HCL 5-20 MG PO CAPS: ORAL_CAPSULE | ORAL | 3 refills | Status: DC

## 2021-08-17 NOTE — Telephone Encounter (Signed)
Amlodipine - Benazepril 5-20 mg # 90 x 3 refills sent to [facility] as requested

## 2021-11-09 ENCOUNTER — Other Ambulatory Visit: Payer: Self-pay

## 2021-11-09 MED ORDER — CARVEDILOL 25 MG PO TABS: ORAL_TABLET | ORAL | 3 refills | Status: DC

## 2022-04-13 ENCOUNTER — Ambulatory Visit: Payer: PPO | Admitting: Cardiology

## 2022-05-29 NOTE — Progress Notes (Unsigned)
Cardiology Office Note:    Date:  06/02/2022   ID:  Russell Wolf, DOB Apr 10, 1950, MRN [id number]  PCP:  Myrlene Broker, MD  Cardiologist:  Shirlee More, MD    Referring MD: Myrlene Broker, MD    ASSESSMENT:    1. Coronary artery disease involving native coronary artery of native heart without angina pectoris   2. Agatston coronary artery calcium score greater than 400   3. Pure hypercholesterolemia   4. Essential hypertension   5. Type 2 diabetes mellitus with hyperglycemia, without long-term current use of insulin (HCC)    PLAN:    In order of problems listed above:  He had mild to moderate nonflow limiting stenoses very high calcium score but fortunately has not had ACS and has no angina New York Heart Association class I after discussion agrees to lipid-lowering therapy Zetia Well-controlled continue combination carvedilol amlodipine and ACE inhibitor Stable diabetes A1c at target no longer taking metformin   Next appointment: 1 year   Medication Adjustments/Labs and Tests Ordered: Current medicines are reviewed at length with the patient today.  Concerns regarding medicines are outlined above.  Orders Placed This Encounter  Procedures   Lipid Profile   EKG 12-Lead   Meds ordered this encounter  Medications   ezetimibe (ZETIA) 10 MG tablet    Sig: Take 1 tablet (10 mg total) by mouth daily.    Dispense:  90 tablet    Refill:  3    Chief Complaint  Patient presents with   Follow-up  For CAD  History of Present Illness:    Russell Wolf is a 72 y.o. male with a hx of  hypertension hyperlipidemia strong family history of premature CAD and symptomatic PVCs along with an elevated coronary artery calcium score of 959/87th percentile.  Subsequent cardiac CTA showed moderate CAD with 40 to 59% stenosis mid LAD 40 to 59% stenosis of proximal right coronary artery last seen 07/22/2021.  Compliance with diet, lifestyle and medications: Yes although he never  started bempedoic acid He saw neurologist regarding neuropathy and was left with the impression is related to cholesterol-lowering medications. I reviewed with him he is high risk with his family history of very high coronary calcium score CAD he does not want to take a statin bempedoic acid or PCSK9 inhibitor but he is agreed to Zetia 10 mg daily and will do a follow-up lipid profile 2 months Most recent lipid profile 07/20/2021 LDL 102 May 2023 his last A1c 6.6% off metformin He is vigorous active and has no exercise intolerance edema shortness of breath chest pain palpitation or syncope Past Medical History:  Diagnosis Date   Acute non-recurrent maxillary sinusitis 02/20/2017   Annual physical exam 03/10/2017   Bronchitis 02/16/2017   Essential hypertension 03/31/2015   Hyperlipidemia 03/31/2015   Mixed dyslipidemia 03/10/2017   PVC's (premature ventricular contractions) 03/31/2015   Type 2 diabetes mellitus with hyperglycemia, without long-term current use of insulin ([place]) 03/10/2017    Past Surgical History:  Procedure Laterality Date   SHOULDER SURGERY      Current Medications: Current Meds  Medication Sig   amLODipine-benazepril (LOTREL) 5-20 MG capsule TAKE 1 CAPSULE BY MOUTH ONCE (1) DAILY   aspirin EC 81 MG tablet Take by mouth daily.   Bempedoic Acid-Ezetimibe (NEXLIZET) 180-10 MG TABS Please take 1 tablet daily   carvedilol (COREG) 25 MG tablet TAKE 1 TABLET BY MOUTH TWICE (2) DAILY WITH A MEAL   Cholecalciferol (VITAMIN D3) 250 MCG (10000 UT)  TABS Take 1 tablet by mouth daily.   ezetimibe (ZETIA) 10 MG tablet Take 1 tablet (10 mg total) by mouth daily.   Flaxseed, Linseed, (FLAX SEED OIL PO) Take by mouth daily.   KRILL OIL PO Take 1 capsule by mouth daily.   loratadine (CLARITIN) 10 MG tablet Take by mouth.   meloxicam (MOBIC) 15 MG tablet Take 1 tablet by mouth daily.   Multiple Vitamin (MULTI-VITAMINS) TABS Take by mouth.   Multiple Vitamins-Minerals  (HAIR/SKIN/NAILS/BIOTIN) TABS Take 1 tablet by mouth daily.   Omega-3 Fatty Acids (FISH OIL) 1000 MG CAPS Take 1 capsule by mouth daily.   OVER THE COUNTER MEDICATION 1 tablet daily. Healthy Feet & Nerves   terbinafine (LAMISIL) 250 MG tablet Take 250 mg by mouth daily.   vitamin C (ASCORBIC ACID) 500 MG tablet Take 500 mg by mouth daily.   VITAMIN K PO Take 1 tablet by mouth daily.   Zinc 50 MG TABS Take 1 tablet by mouth daily.     Allergies:   Niacin and Levofloxacin   Social History   Socioeconomic History   Marital status: Married    Spouse name: Not on file   Number of children: Not on file   Years of education: Not on file   Highest education level: Not on file  Occupational History   Not on file  Tobacco Use   Smoking status: Never   Smokeless tobacco: Never  Substance and Sexual Activity   Alcohol use: Not on file   Drug use: Not on file   Sexual activity: Not on file  Other Topics Concern   Not on file  Social History Narrative   Not on file   Social Determinants of Health   Financial Resource Strain: Not on file  Food Insecurity: Not on file  Transportation Needs: Not on file  Physical Activity: Not on file  Stress: Not on file  Social Connections: Not on file     Family History: The patient's family history includes Breast cancer in his mother; CAD in his father; Heart disease in his brother and maternal uncle; Supraventricular tachycardia in his mother. ROS:   Please see the history of present illness.    All other systems reviewed and are negative.  EKGs/Labs/Other Studies Reviewed:    The following studies were reviewed today:  Cardiac Studies & Procedures          CT SCANS  CT CARDIAC SCORING (SELF PAY ONLY) 10/08/2020  Addendum 10/08/2020 11:12 AM ADDENDUM REPORT: 10/08/2020 11:10  CLINICAL DATA:  Cardiovascular Disease Risk stratification  EXAM: Coronary Calcium Score  TECHNIQUE: A gated, non-contrast computed tomography scan of the  heart was performed using 60mm slice thickness. Axial images were analyzed on a dedicated workstation. Calcium scoring of the coronary arteries was performed using the Agatston method.  FINDINGS: Coronary Calcium Score:  Left main: 0  Left anterior descending artery: 406  Left circumflex artery: 106  Right coronary artery: 447  Total: 959  Percentile: 87th  Pericardium: Normal.  Ascending Aorta: Normal caliber.  Non-cardiac: See separate report from [facility].  IMPRESSION: Coronary calcium score of 959. This was 87th percentile for age-, race-, and sex-matched controls.  RECOMMENDATIONS: Coronary artery calcium (CAC) score is a strong predictor of incident coronary heart disease (CHD) and provides predictive information beyond traditional risk factors. CAC scoring is reasonable to use in the decision to withhold, postpone, or initiate statin therapy in intermediate-risk or selected borderline-risk asymptomatic adults (age 71-75 years and  LDL-C >=70 to <190 mg/dL) who do not have diabetes or established atherosclerotic cardiovascular disease (ASCVD).* In intermediate-risk (10-year ASCVD risk >=7.5% to <20%) adults or selected borderline-risk (10-year ASCVD risk >=5% to <7.5%) adults in whom a CAC score is measured for the purpose of making a treatment decision the following recommendations have been made:  If CAC=0, it is reasonable to withhold statin therapy and reassess in 5 to 10 years, as long as higher risk conditions are absent (diabetes mellitus, family history of premature CHD in first degree relatives (males <55 years; females <65 years), cigarette smoking, or LDL >=190 mg/dL).  If CAC is 1 to 99, it is reasonable to initiate statin therapy for patients >=62 years of age.  If CAC is >=100 or >=75th percentile, it is reasonable to initiate statin therapy at any age.  Cardiology referral should be considered for patients with CAC scores >=400  or >=75th percentile.  *2018 AHA/ACC/AACVPR/AAPA/ABC/ACPM/ADA/AGS/APhA/ASPC/NLA/PCNA Guideline on the Management of Blood Cholesterol: A Report of the American College of Cardiology/American Heart Association Task Force on Clinical Practice Guidelines. J Am Coll Cardiol. 2019;73(24):3168-3209.  Eleonore Chiquito, MD   Electronically Signed By: Eleonore Chiquito On: 10/08/2020 11:10  Narrative EXAM: OVER-READ INTERPRETATION  CT CHEST  The following report is an over-read performed by radiologist Dr. Vinnie Langton of [facility], [place] on 10/08/2020. This over-read does not include interpretation of cardiac or coronary anatomy or pathology. The coronary calcium score/coronary CTA interpretation by the cardiologist is attached.  COMPARISON:  None.  FINDINGS: Atherosclerotic calcifications in the thoracic aorta. Small calcified granulomas in the lungs. Within the visualized portions of the thorax there are no other suspicious appearing pulmonary nodules or masses, there is no acute consolidative airspace disease, no pleural effusions, no pneumothorax and no lymphadenopathy. Visualized portions of the upper abdomen are unremarkable. There are no aggressive appearing lytic or blastic lesions noted in the visualized portions of the skeleton.  IMPRESSION: 1.  Aortic Atherosclerosis (ICD10-I70.0).  Electronically Signed: By: Vinnie Langton M.D. On: 10/08/2020 10:26   CT SCANS  CT CORONARY MORPH W/CTA COR W/SCORE 10/21/2020  Addendum 10/21/2020 10:33 AM ADDENDUM REPORT: 10/21/2020 10:31  CLINICAL DATA:  Chest pain  EXAM: Cardiac CTA  MEDICATIONS: Sub lingual nitro. 4 mg and lopressor 100mg   TECHNIQUE: The patient was scanned on a [employer] 192 scanner. Gantry rotation speed was 250 msecs. Collimation was. 6 mm . A 120 kV prospective scan was triggered in the ascending thoracic aorta at 140 HU's with full mA between 30-70% of the R-R interval . Average HR  during the scan was 61 bpm. The 3D data set was interpreted on a dedicated work station using MPR, MIP and VRT modes. A total of 80 cc of contrast was used.  FINDINGS: Non-cardiac: See separate report from [facility]. No significant findings on limited lung and soft tissue windows.  Calcium score: 3 vessel coronary calcium  LM: 0  LAD: 486  RCA 451  Circumflex 57.7  Total: 995  Coronary Arteries: Right dominant with no anomalies  LM: Normal  LAD: 40-59% calcified plaque proximal and mid vessel  IM: Large vessel 1-24% calcified plaque proximally  D1: Normal  D2: Normal  Circumflex: 1-24% proximal and mid vessel stenosis  OM1: Normal  OM2: Normal  RCA: 40-59% calcified plaque proximally 1-24% calcified plaque mid and distal vessel  PDA: 1-24% calcified plaque  PLA: 1-24% calcified plaque  IMPRESSION: 1. Calcium score 995 which is 41 th percentile for age / sex Involving  all 3 major epicardial vessels  2.  Normal aortic root 3.3 cm  3. CAD RADS 2 non obstructive CAD right dominant see description above  Jenkins Rouge   Electronically Signed By: Jenkins Rouge M.D. On: 10/21/2020 10:31  Narrative EXAM: OVER-READ INTERPRETATION  CT CHEST  The following report is an over-read performed by radiologist Dr. Abigail Miyamoto of [facility], [place] on 10/21/2020. This over-read does not include interpretation of cardiac or coronary anatomy or pathology. The coronary CTA interpretation by the cardiologist is attached.  COMPARISON:  10/08/2020 calcium score CT  FINDINGS: Vascular: Normal aortic caliber. Aortic atherosclerosis. No central pulmonary embolism, on this non-dedicated study.  Mediastinum/Nodes: No imaged thoracic adenopathy.  Lungs/Pleura: No pleural fluid. Right lower lobe calcified granulomas, including on 30/12.  Upper Abdomen: Normal imaged portions of the liver, spleen, stomach.  Musculoskeletal: No acute osseous  abnormality.  IMPRESSION: 1. No acute findings in the imaged extracardiac chest. 2. Aortic Atherosclerosis (ICD10-I70.0).  Electronically Signed: By: Abigail Miyamoto M.D. On: 10/21/2020 08:45          EKG:  EKG ordered today and personally reviewed.  The ekg ordered today demonstrates sinus rhythm normal EKG  Recent Labs: 07/20/2021: ALT 25; BUN 22; Creatinine, Ser 1.06; Potassium 4.9; Sodium 138  Recent Lipid Panel    Component Value Date/Time   CHOL 164 07/20/2021 0931   TRIG 174 (H) 07/20/2021 0931   HDL 31 (L) 07/20/2021 0931   CHOLHDL 5.3 (H) 07/20/2021 0931   LDLCALC 102 (H) 07/20/2021 0931    Physical Exam:    VS:  BP 130/80 (BP Location: Right Arm, Patient Position: Sitting, Cuff Size: Normal)   Pulse 71   Ht 6\' 2"  (1.88 m)   Wt 191 lb (86.6 kg)   SpO2 96%   BMI 24.52 kg/m     Wt Readings from Last 3 Encounters:  06/02/22 191 lb (86.6 kg)  07/22/21 184 lb 12.8 oz (83.8 kg)  01/21/21 177 lb 9.6 oz (80.6 kg)     GEN: No xanthoma or xanthelasma well nourished, well developed in no acute distress HEENT: Normal NECK: No JVD; No carotid bruits LYMPHATICS: No lymphadenopathy CARDIAC: RRR, no murmurs, rubs, gallops RESPIRATORY:  Clear to auscultation without rales, wheezing or rhonchi  ABDOMEN: Soft, non-tender, non-distended MUSCULOSKELETAL:  No edema; No deformity  SKIN: Warm and dry NEUROLOGIC:  Alert and oriented x 3 PSYCHIATRIC:  Normal affect    Signed, Shirlee More, MD  06/02/2022 8:39 AM    [place]

## 2022-06-02 ENCOUNTER — Ambulatory Visit: Payer: PPO | Attending: Cardiology | Admitting: Cardiology

## 2022-06-02 ENCOUNTER — Encounter: Payer: Self-pay | Admitting: Cardiology

## 2022-06-02 VITALS — BP 130/80 | HR 71 | Ht 74.0 in | Wt 191.0 lb

## 2022-06-02 DIAGNOSIS — R931 Abnormal findings on diagnostic imaging of heart and coronary circulation: Secondary | ICD-10-CM | POA: Diagnosis not present

## 2022-06-02 DIAGNOSIS — I2510 Atherosclerotic heart disease of native coronary artery without angina pectoris: Secondary | ICD-10-CM | POA: Diagnosis not present

## 2022-06-02 DIAGNOSIS — E1165 Type 2 diabetes mellitus with hyperglycemia: Secondary | ICD-10-CM

## 2022-06-02 DIAGNOSIS — I10 Essential (primary) hypertension: Secondary | ICD-10-CM

## 2022-06-02 DIAGNOSIS — E7800 Pure hypercholesterolemia, unspecified: Secondary | ICD-10-CM

## 2022-06-02 MED ORDER — EZETIMIBE 10 MG PO TABS: 10.0000 mg | ORAL_TABLET | Freq: Every day | ORAL | 3 refills | Status: DC

## 2022-06-02 NOTE — Patient Instructions (Addendum)
Medication Instructions:  Your physician has recommended you make the following change in your medication:   START: Zetia 10 mg daily  *If you need a refill on your cardiac medications before your next appointment, please call your pharmacy*   Lab Work: Your physician recommends that you return for lab work in:   Labs in 2 months: Lipids  If you have labs (blood work) drawn today and your tests are completely normal, you will receive your results only by: [place] (if you have [place]) OR A paper copy in the mail If you have any lab test that is abnormal or we need to change your treatment, we will call you to review the results.   Testing/Procedures: None   Follow-Up: At [facility], you and your health needs are our priority.  As part of our continuing mission to provide you with exceptional heart care, we have created designated Provider Care Teams.  These Care Teams include your primary Cardiologist (physician) and Advanced Practice Providers (APPs -  Physician Assistants and Nurse Practitioners) who all work together to provide you with the care you need, when you need it.  We recommend signing up for the patient portal called "MyChart".  Sign up information is provided on this After Visit Summary.  MyChart is used to connect with patients for Virtual Visits (Telemedicine).  Patients are able to view lab/test results, encounter notes, upcoming appointments, etc.  Non-urgent messages can be sent to your provider as well.   To learn more about what you can do with MyChart, go to [email or web address].    Your next appointment:   1 year(s)  Provider:   Shirlee More, MD    Other Instructions None

## 2022-06-04 IMAGING — CT CT CARDIAC CORONARY ARTERY CALCIUM SCORE
3 series · 14 of 20 positions shown, 15 images · non-contrast
Comparison: None.
COMPARISON: None.

Addendum:
EXAM:
OVER-READ INTERPRETATION  CT CHEST

The following report is an over-read performed by radiologist Dr.
Conrrado Kogan [REDACTED] on 10/08/2020. This
over-read does not include interpretation of cardiac or coronary
anatomy or pathology. The coronary calcium score/coronary CTA
interpretation by the cardiologist is attached.
CLINICAL DATA: Cardiovascular Disease Risk stratification
Coronary Calcium Score
TECHNIQUE: A gated, non-contrast computed tomography scan of the heart was
performed using 3mm slice thickness. Axial images were analyzed on a
dedicated workstation. Calcium scoring of the coronary arteries was
performed using the Agatston method.

[Series 2: casc 3.0 bv41 2 bestdiast 68 % · axial · 0.33mm/px · z∈[+1459,+1546]mm · 4 of 49 slices shown, 5 images]
[im 10/49  vessel]
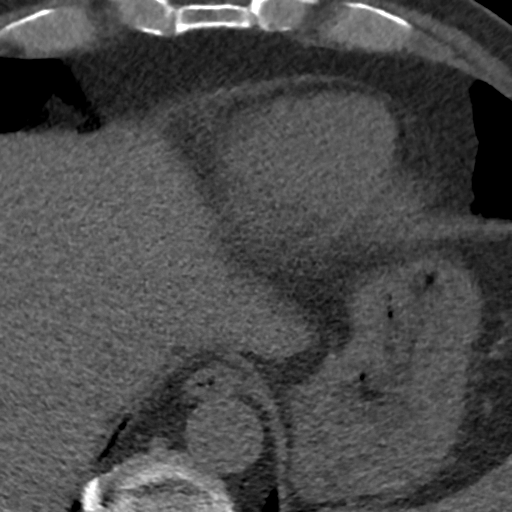
[im 10/49  lung]
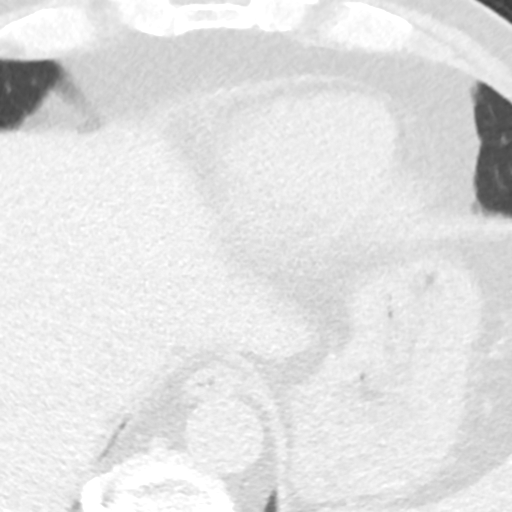
[im 20/49  vessel]
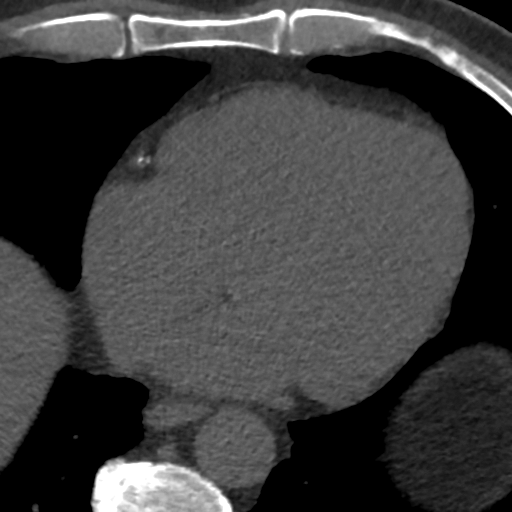
[im 29/49  vessel]
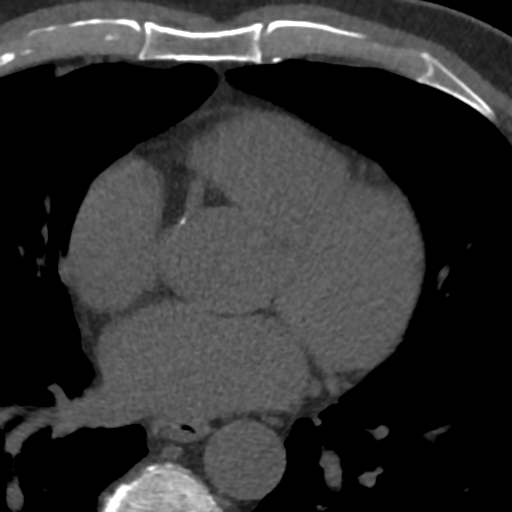
[im 39/49  vessel]
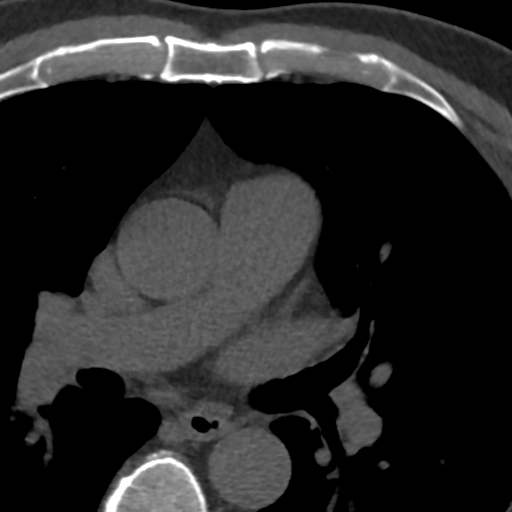

[Series 3: lung 70 % · axial · 0.68mm/px · z∈[+1456,+1552]mm · 5 of 49 slices shown]
[im 9/49  lung]
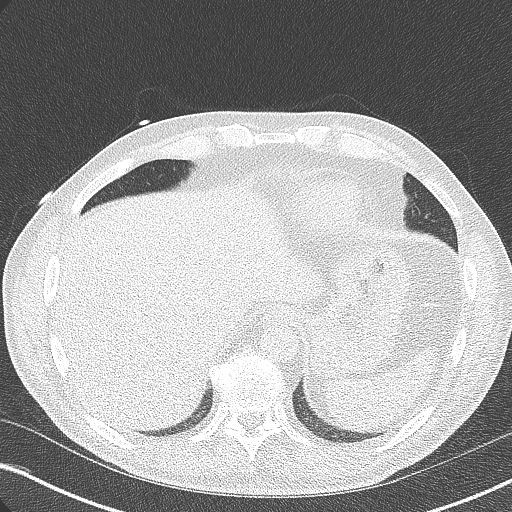
[im 17/49  lung]
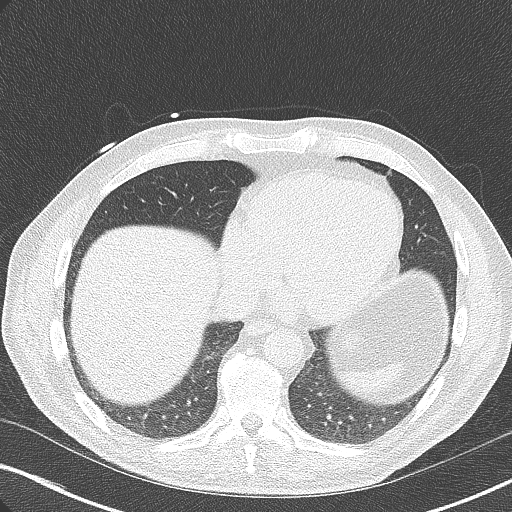
[im 25/49  lung]
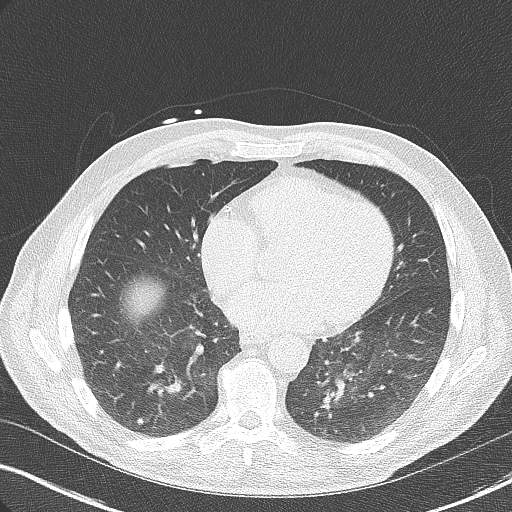
[im 33/49  lung]
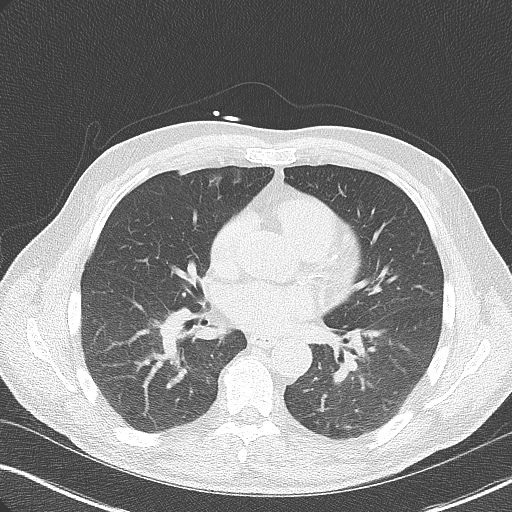
[im 41/49  lung]
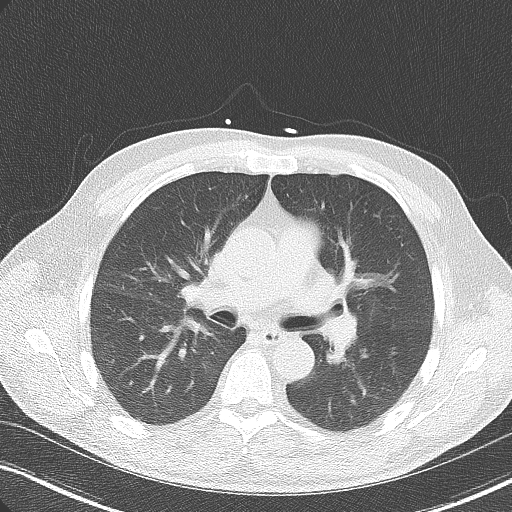

[Series 4: lung st 70 % · axial · 0.68mm/px · z∈[+1456,+1552]mm · 5 of 49 slices shown]
[im 9/49  lung]
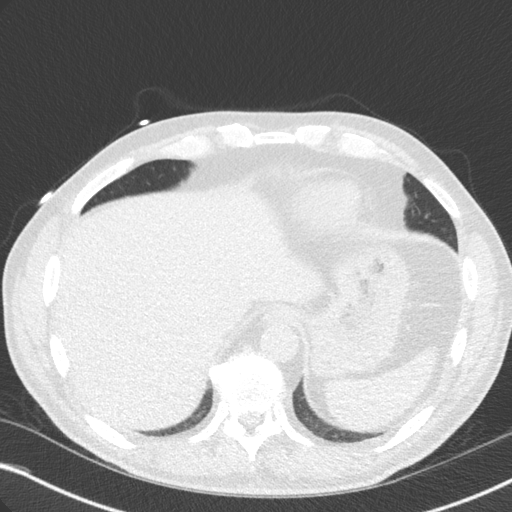
[im 17/49  lung]
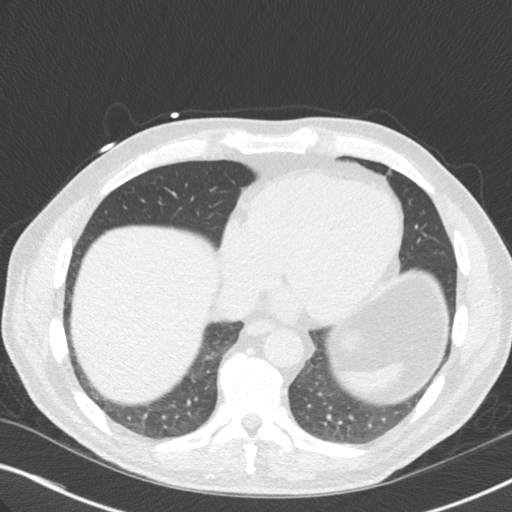
[im 25/49  lung]
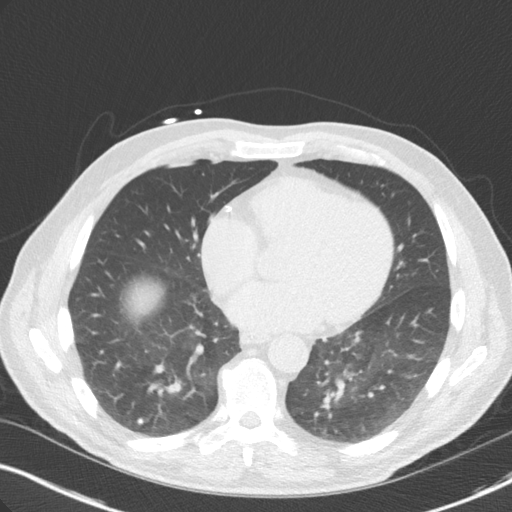
[im 33/49  lung]
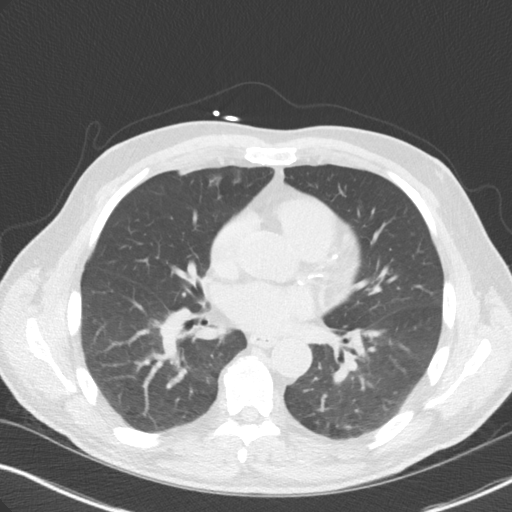
[im 41/49  lung]
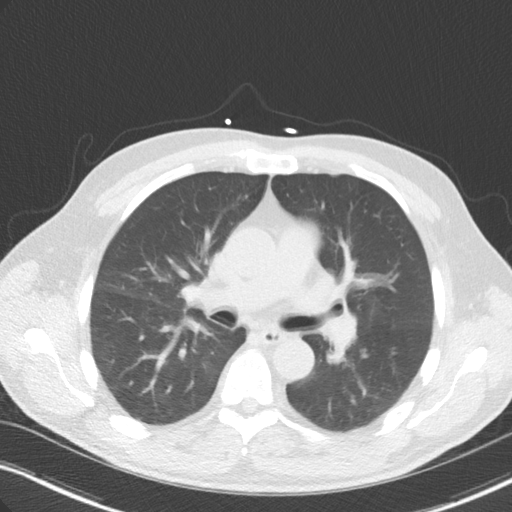

[14 of 20 positions shown; findings below may reference images not displayed]

FINDINGS: Atherosclerotic calcifications in the thoracic aorta. Small
calcified granulomas in the lungs. Within the visualized portions of
the thorax there are no other suspicious appearing pulmonary nodules
or masses, there is no acute consolidative airspace disease, no
pleural effusions, no pneumothorax and no lymphadenopathy.
Visualized portions of the upper abdomen are unremarkable. There are
no aggressive appearing lytic or blastic lesions noted in the
visualized portions of the skeleton.
IMPRESSION: 1.  Aortic Atherosclerosis ([id number]-[id number].C).
FINDINGS: Coronary Calcium Score:

Left main: 0

Left anterior descending artery: 406

Left circumflex artery: 106

Right coronary artery: 447

Total: 959

Percentile: 87th

Pericardium: Normal.

Ascending Aorta: Normal caliber.

Non-cardiac: See separate report from [REDACTED].
IMPRESSION: Coronary calcium score of 959. This was 87th percentile for age-,
race-, and sex-matched controls.



If CAC=0, it is reasonable to withhold statin therapy and reassess
in 5 to 10 years, as long as higher risk conditions are absent
(diabetes mellitus, family history of premature CHD in first degree
relatives (males <55 years; females <65 years), cigarette smoking,
or LDL >=190 mg/dL).

If CAC is 1 to 99, it is reasonable to initiate statin therapy for
patients >=55 years of age.

If CAC is >=100 or >=75th percentile, it is reasonable to initiate
statin therapy at any age.

Cardiology referral should be considered for patients with CAC
scores >=400 or >=75th percentile.

*4769 AHA/ACC/AACVPR/AAPA/ABC/DARCHAN/CANIZALEZ/PETRONILA/Jumper/NYA/FOMEZ/MUTWAKEL
Guideline on the Management of Blood Cholesterol: A Report of the
American College of Cardiology/American Heart Association Task Force
on Clinical Practice Guidelines. J Am Coll Cardiol.
9288;73(24):8032-8210.

*** End of Addendum ***
EXAM:
OVER-READ INTERPRETATION  CT CHEST

The following report is an over-read performed by radiologist Dr.
Conrrado Kogan [REDACTED] on 10/08/2020. This
over-read does not include interpretation of cardiac or coronary
anatomy or pathology. The coronary calcium score/coronary CTA
interpretation by the cardiologist is attached.
FINDINGS: Atherosclerotic calcifications in the thoracic aorta. Small
calcified granulomas in the lungs. Within the visualized portions of
the thorax there are no other suspicious appearing pulmonary nodules
or masses, there is no acute consolidative airspace disease, no
pleural effusions, no pneumothorax and no lymphadenopathy.
Visualized portions of the upper abdomen are unremarkable. There are
no aggressive appearing lytic or blastic lesions noted in the
visualized portions of the skeleton.
IMPRESSION: 1.  Aortic Atherosclerosis ([id number]-[id number].C).

## 2022-06-07 ENCOUNTER — Other Ambulatory Visit: Payer: Self-pay

## 2022-06-07 ENCOUNTER — Telehealth: Payer: Self-pay | Admitting: Cardiology

## 2022-06-07 MED ORDER — CARVEDILOL 25 MG PO TABS: ORAL_TABLET | ORAL | 3 refills | Status: DC

## 2022-06-07 NOTE — Telephone Encounter (Signed)
Pt c/o medication issue:  1. Name of Medication: carvedilol (COREG) 25 MG tablet   2. How are you currently taking this medication (dosage and times per day)?   TAKE 1 TABLET BY MOUTH TWICE (2) DAILY WITH A MEAL    3. Are you having a reaction (difficulty breathing--STAT)? no  4. What is your medication issue? Pharmacy is calling for claififcation. Patient is stating th the suppose to take half a tablet each day. Please advise

## 2022-06-08 NOTE — Telephone Encounter (Signed)
Called Arista at [facility] pharmacy and informed her of the dosage that Dr. Bettina Gavia would like the patient to be on:  "I did not change the dose I would continue as in his med reconciliation 25 mg twice daily"  Arista was appreciative for the call and had no further questions at this time.

## 2022-06-08 NOTE — Telephone Encounter (Signed)
Attempted to inform the patient of the dose of carvedilol that Dr. Bettina Gavia would like him to take. Patient did not answer the phone. Left message for the patient to call back.

## 2022-06-09 NOTE — Telephone Encounter (Signed)
Called the patient's wife and informed her of Dr. Joya Gaskins recommendation below:  "I did not change the dose I would continue as in his med reconciliation 25 mg twice daily"  The patient's wife verbalized understanding and had no further questions at this time.

## 2022-06-09 NOTE — Telephone Encounter (Signed)
Pt called returning nurses call is requesting a callback. Please advise.

## 2022-06-17 IMAGING — CT CT HEART MORP W/ CTA COR W/ SCORE W/ CA W/CM &/OR W/O CM
4 of 7 series · 8 of 20 positions shown, 9 images · IV contrast (APPLIED)
Comparison: 10/08/2020 calcium score CT
COMPARISON: 10/08/2020 calcium score CT

Addendum:
EXAM:
OVER-READ INTERPRETATION  CT CHEST

The following report is an over-read performed by radiologist Dr.
Chevon Wine [REDACTED] on 10/21/2020. This over-read
does not include interpretation of cardiac or coronary anatomy or
pathology. The coronary CTA interpretation by the cardiologist is
attached.
CLINICAL DATA: Chest pain
Cardiac CTA
MEDICATIONS:
Sub lingual nitro. 4 mg and lopressor 100mg
TECHNIQUE: The patient was scanned on a Siemens Force 192 scanner. Gantry
rotation speed was 250 msecs. Collimation was. 6 mm . A 120 kV
prospective scan was triggered in the ascending thoracic aorta at
140 HU's with full mA between 30-70% of the R-R interval . Average
HR during the scan was 61 bpm. The 3D data set was interpreted on a
dedicated work station using MPR, MIP and VRT modes. A total of 80
cc of contrast was used.

[Series 6: best diast 68 % · axial · 0.39mm/px · z∈[+1230,+1269]mm · 2 of 296 slices shown]
[im 99/296  vessel]
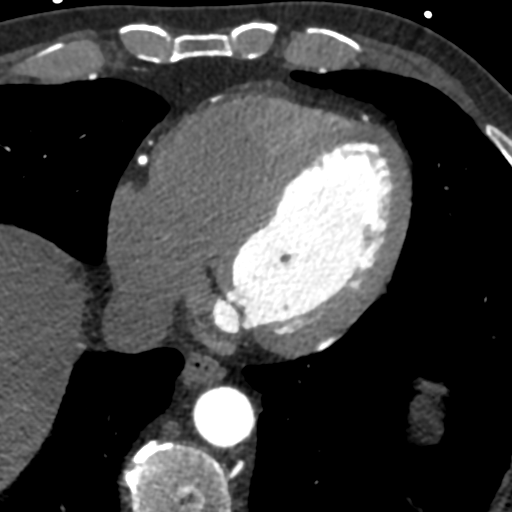
[im 197/296  vessel]
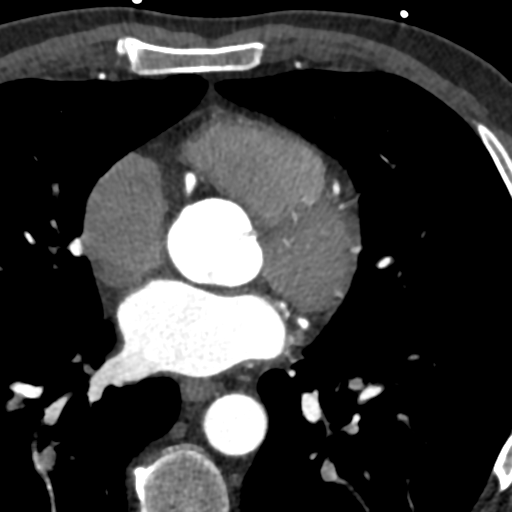

[Series 8: best syst · axial · 0.39mm/px · z∈[+1230,+1269]mm · 2 of 296 slices shown, 3 images]
[im 99/296  vessel]
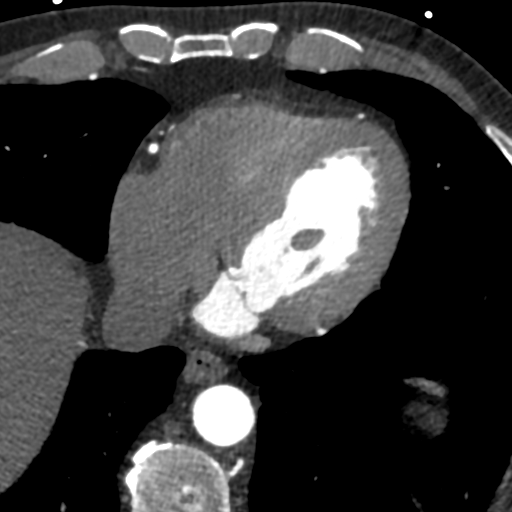
[im 99/296  lung]
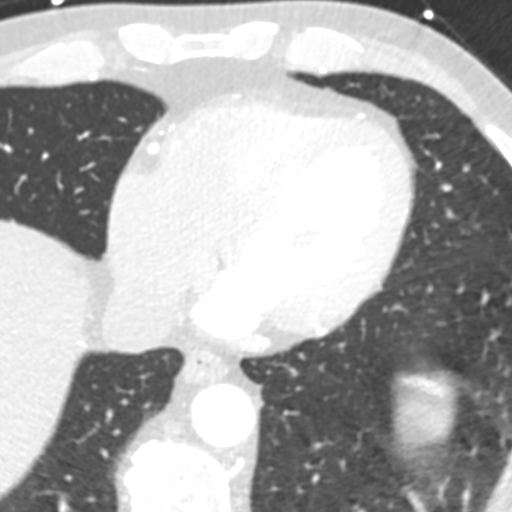
[im 197/296  vessel]
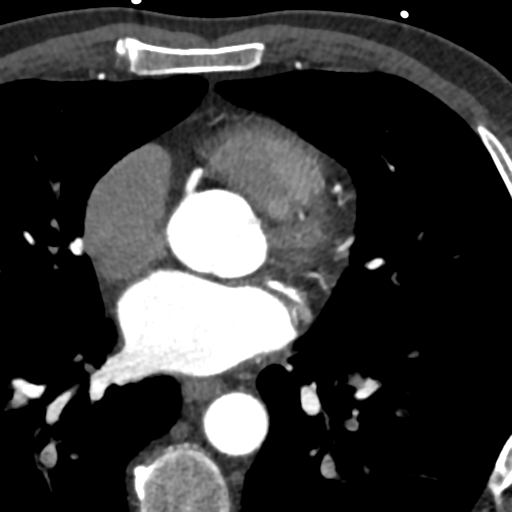

[Series 9: ts diast sharp 68 % · axial · 0.39mm/px · z∈[+1230,+1269]mm · 2 of 296 slices shown]
[im 99/296  lung]
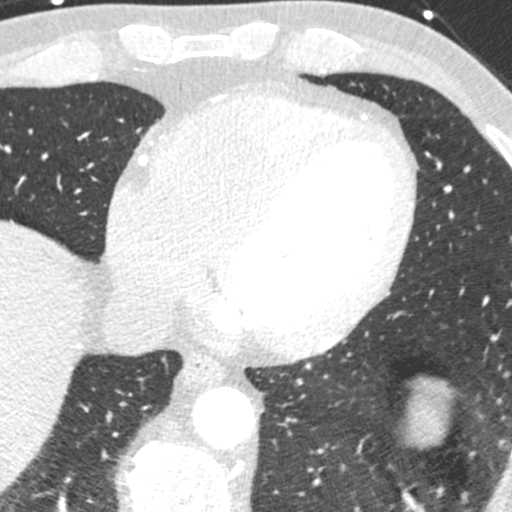
[im 197/296  lung]
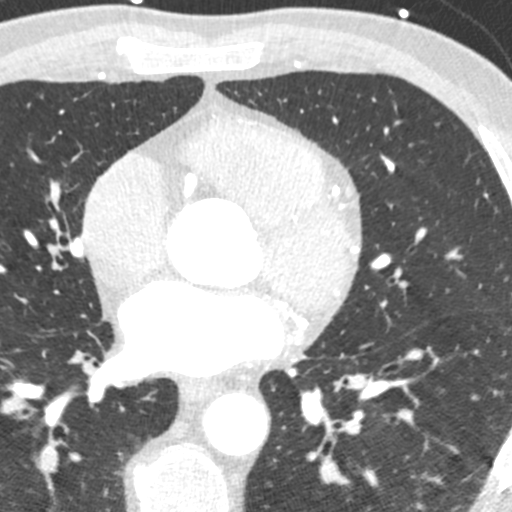

[Series 10: ts syst sharp · axial · 0.39mm/px · z∈[+1230,+1269]mm · 2 of 296 slices shown]
[im 99/296  lung]
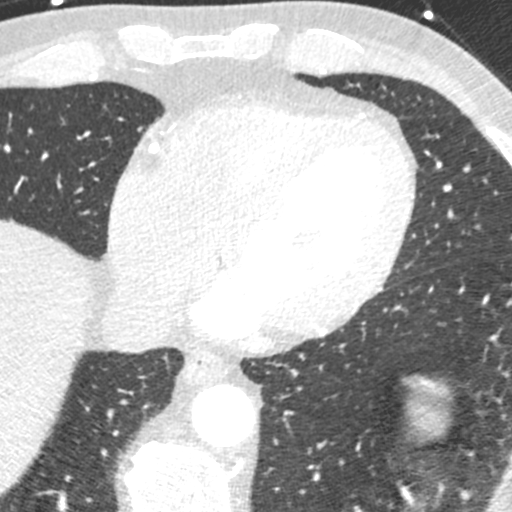
[im 197/296  lung]
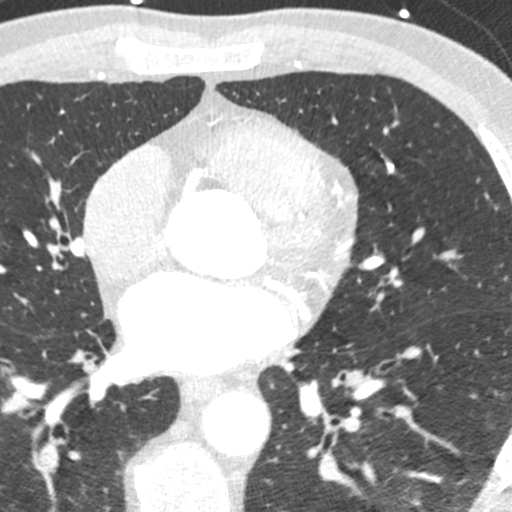

[8 of 20 positions shown; findings below may reference images not displayed]

FINDINGS: Vascular: Normal aortic caliber. Aortic atherosclerosis. No central
pulmonary embolism, on this non-dedicated study.

Mediastinum/Nodes: No imaged thoracic adenopathy.

Lungs/Pleura: No pleural fluid. Right lower lobe calcified
granulomas, including on [DATE].

Upper Abdomen: Normal imaged portions of the liver, spleen, stomach.

Musculoskeletal: No acute osseous abnormality.
IMPRESSION: 1. No acute findings in the imaged extracardiac chest.
2. Aortic Atherosclerosis (AFD61-72R.R).
FINDINGS: Non-cardiac: See separate report from [REDACTED]. No
significant findings on limited lung and soft tissue windows.

Calcium score: 3 vessel coronary calcium

LM: 0

LAD: 486

RCA 451

Circumflex

Total: 995

Coronary Arteries: Right dominant with no anomalies

LM: Normal

LAD: 40-59% calcified plaque proximal and mid vessel

IM: Large vessel 1-24% calcified plaque proximally

D1: Normal

D2: Normal

Circumflex: 1-24% proximal and mid vessel stenosis

OM1: Normal

OM2: Normal

RCA: 40-59% calcified plaque proximally 1-24% calcified plaque mid
and distal vessel

PDA: 1-24% calcified plaque

PLA: 1-24% calcified plaque
IMPRESSION: 1. Calcium score 995 which is 87 th percentile for age / sex
Involving all 3 major epicardial vessels

2.  Normal aortic root 3.3 cm

3. CAD RADS 2 non obstructive CAD right dominant see description
above

Chmiso Wiwi

*** End of Addendum ***
EXAM:
OVER-READ INTERPRETATION  CT CHEST

The following report is an over-read performed by radiologist Dr.
Chevon Wine [REDACTED] on 10/21/2020. This over-read
does not include interpretation of cardiac or coronary anatomy or
pathology. The coronary CTA interpretation by the cardiologist is
attached.
FINDINGS: Vascular: Normal aortic caliber. Aortic atherosclerosis. No central
pulmonary embolism, on this non-dedicated study.

Mediastinum/Nodes: No imaged thoracic adenopathy.

Lungs/Pleura: No pleural fluid. Right lower lobe calcified
granulomas, including on [DATE].

Upper Abdomen: Normal imaged portions of the liver, spleen, stomach.

Musculoskeletal: No acute osseous abnormality.
IMPRESSION: 1. No acute findings in the imaged extracardiac chest.
2. Aortic Atherosclerosis (AFD61-72R.R).

## 2022-08-18 ENCOUNTER — Other Ambulatory Visit: Payer: Self-pay | Admitting: Cardiology

## 2023-01-17 ENCOUNTER — Encounter: Payer: Self-pay | Admitting: Gastroenterology

## 2023-05-15 ENCOUNTER — Other Ambulatory Visit: Payer: Self-pay

## 2023-05-15 MED ORDER — EZETIMIBE 10 MG PO TABS: 10.0000 mg | ORAL_TABLET | Freq: Every day | ORAL | 0 refills | Status: DC

## 2023-05-15 NOTE — Telephone Encounter (Signed)
 Prescription sent to pharmacy.

## 2023-05-25 LAB — LIPID PANEL
Chol/HDL Ratio: 6.4 ratio — ABNORMAL HIGH (ref 0.0–5.0)
Cholesterol, Total: 178 mg/dL (ref 100–199)
HDL: 28 mg/dL — ABNORMAL LOW (ref >39)
LDL Chol Calc (NIH): 102 mg/dL — ABNORMAL HIGH (ref 0–99)
Triglycerides: 280 mg/dL — ABNORMAL HIGH (ref 0–149)
VLDL Cholesterol Cal: 48 mg/dL — ABNORMAL HIGH (ref 5–40)

## 2023-05-26 ENCOUNTER — Encounter: Payer: Self-pay | Admitting: *Deleted

## 2023-05-26 ENCOUNTER — Other Ambulatory Visit: Payer: Self-pay | Admitting: *Deleted

## 2023-05-28 NOTE — Progress Notes (Signed)
 Cardiology Office Note:  .   Date:  05/29/2023  ID:  Neilan Rizzo, DOB [birth year], MRN [id number] PCP: Hadley Pen, MD  [facility] Providers Cardiologist:  Norman Herrlich, MD    History of Present Illness: .   Katie Moch is a 73 y.o. male with a past medical history of nonobstructive CAD, hypertension, PVCs, DM2, dyslipidemia with multiple intolerances to lipid-lowering medications, erectile dysfunction.  10/08/2020 coronary CTA calcium score of 995, 87th percentile, calcium noted in all 3 major epicardial vessels  Most recently evaluated by Dr. Dulce Sellar on 06/02/2022, was stable from a cardiac perspective, ezetimibe was added to his medication regimen but no other changes were made and he was advised to follow-up in 1 year.  He presents today for follow-up of his CAD and hypertension.  He has been doing well from a cardiac perspective, offers no formal complaints today.  He has been bothered by some upper GI symptoms and is following with Dr. Jennye Boroughs.  He is not interested in any lipid-lowering agents, he is aware his cholesterol is not well-controlled.  He is also not taking anything for his diabetes.  He does not participate in any formal activity however he does stay very active around his home. He denies chest pain, palpitations, dyspnea, pnd, orthopnea, n, v, dizziness, syncope, edema, weight gain, or early satiety.   ROS: Review of Systems  Gastrointestinal:  Positive for heartburn.  All other systems reviewed and are negative.    Studies Reviewed: Marland Kitchen   EKG Interpretation Date/Time:  Monday May 29 2023 08:16:08 EDT Ventricular Rate:  62 PR Interval:  164 QRS Duration:  80 QT Interval:  392 QTC Calculation: 397 R Axis:   57  Text Interpretation: Normal sinus rhythm Normal ECG No previous ECGs available Confirmed by Wallis Bamberg [phone]) on 05/29/2023 8:18:42 AM    Cardiac Studies & Procedures    ______________________________________________________________________________________________          CT SCANS  CT CARDIAC SCORING (SELF PAY ONLY) 10/08/2020  Addendum 10/08/2020 11:12 AM ADDENDUM REPORT: 10/08/2020 11:10  CLINICAL DATA:  Cardiovascular Disease Risk stratification  EXAM: Coronary Calcium Score  TECHNIQUE: A gated, non-contrast computed tomography scan of the heart was performed using 3mm slice thickness. Axial images were analyzed on a dedicated workstation. Calcium scoring of the coronary arteries was performed using the Agatston method.  FINDINGS: Coronary Calcium Score:  Left main: 0  Left anterior descending artery: 406  Left circumflex artery: 106  Right coronary artery: 447  Total: 959  Percentile: 87th  Pericardium: Normal.  Ascending Aorta: Normal caliber.  Non-cardiac: See separate report from [facility].  IMPRESSION: Coronary calcium score of 959. This was 87th percentile for age-, race-, and sex-matched controls.  RECOMMENDATIONS: Coronary artery calcium (CAC) score is a strong predictor of incident coronary heart disease (CHD) and provides predictive information beyond traditional risk factors. CAC scoring is reasonable to use in the decision to withhold, postpone, or initiate statin therapy in intermediate-risk or selected borderline-risk asymptomatic adults (age 62-75 years and LDL-C >=70 to <190 mg/dL) who do not have diabetes or established atherosclerotic cardiovascular disease (ASCVD).* In intermediate-risk (10-year ASCVD risk >=7.5% to <20%) adults or selected borderline-risk (10-year ASCVD risk >=5% to <7.5%) adults in whom a CAC score is measured for the purpose of making a treatment decision the following recommendations have been made:  If CAC=0, it is reasonable to withhold statin therapy and reassess in 5 to 10 years, as long as higher risk conditions are absent (  diabetes mellitus, family history of  premature CHD in first degree relatives (males <55 years; females <65 years), cigarette smoking, or LDL >=190 mg/dL).  If CAC is 1 to 99, it is reasonable to initiate statin therapy for patients >=94 years of age.  If CAC is >=100 or >=75th percentile, it is reasonable to initiate statin therapy at any age.  Cardiology referral should be considered for patients with CAC scores >=400 or >=75th percentile.  *2018 AHA/ACC/AACVPR/AAPA/ABC/ACPM/ADA/AGS/APhA/ASPC/NLA/PCNA Guideline on the Management of Blood Cholesterol: A Report of the American College of Cardiology/American Heart Association Task Force on Clinical Practice Guidelines. J Am Coll Cardiol. 2019;73(24):3168-3209.  Lennie Odor, MD   Electronically Signed By: Lennie Odor On: 10/08/2020 11:10  Narrative EXAM: OVER-READ INTERPRETATION  CT CHEST  The following report is an over-read performed by radiologist Dr. Trudie Reed of [facility] on 10/08/2020. This over-read does not include interpretation of cardiac or coronary anatomy or pathology. The coronary calcium score/coronary CTA interpretation by the cardiologist is attached.  COMPARISON:  None.  FINDINGS: Atherosclerotic calcifications in the thoracic aorta. Small calcified granulomas in the lungs. Within the visualized portions of the thorax there are no other suspicious appearing pulmonary nodules or masses, there is no acute consolidative airspace disease, no pleural effusions, no pneumothorax and no lymphadenopathy. Visualized portions of the upper abdomen are unremarkable. There are no aggressive appearing lytic or blastic lesions noted in the visualized portions of the skeleton.  IMPRESSION: 1.  Aortic Atherosclerosis (ICD10-I70.0).  Electronically Signed: By: Trudie Reed M.D. On: 10/08/2020 10:26   CT SCANS  CT CORONARY MORPH W/CTA COR W/SCORE 10/21/2020  Addendum 10/21/2020 10:33 AM ADDENDUM REPORT: 10/21/2020  10:31  CLINICAL DATA:  Chest pain  EXAM: Cardiac CTA  MEDICATIONS: Sub lingual nitro. 4 mg and lopressor 100mg   TECHNIQUE: The patient was scanned on a [employer] 192 scanner. Gantry rotation speed was 250 msecs. Collimation was. 6 mm . A 120 kV prospective scan was triggered in the ascending thoracic aorta at 140 HU's with full mA between 30-70% of the R-R interval . Average HR during the scan was 61 bpm. The 3D data set was interpreted on a dedicated work station using MPR, MIP and VRT modes. A total of 80 cc of contrast was used.  FINDINGS: Non-cardiac: See separate report from [facility]. No significant findings on limited lung and soft tissue windows.  Calcium score: 3 vessel coronary calcium  LM: 0  LAD: 486  RCA 451  Circumflex 57.7  Total: 995  Coronary Arteries: Right dominant with no anomalies  LM: Normal  LAD: 40-59% calcified plaque proximal and mid vessel  IM: Large vessel 1-24% calcified plaque proximally  D1: Normal  D2: Normal  Circumflex: 1-24% proximal and mid vessel stenosis  OM1: Normal  OM2: Normal  RCA: 40-59% calcified plaque proximally 1-24% calcified plaque mid and distal vessel  PDA: 1-24% calcified plaque  PLA: 1-24% calcified plaque  IMPRESSION: 1. Calcium score 995 which is 26 th percentile for age / sex Involving all 3 major epicardial vessels  2.  Normal aortic root 3.3 cm  3. CAD RADS 2 non obstructive CAD right dominant see description above  Charlton Haws   Electronically Signed By: Charlton Haws M.D. On: 10/21/2020 10:31  Narrative EXAM: OVER-READ INTERPRETATION  CT CHEST  The following report is an over-read performed by radiologist Dr. Jeronimo Greaves of [facility] on 10/21/2020. This over-read does not include interpretation of cardiac or coronary anatomy or pathology.  The coronary CTA interpretation by the cardiologist is attached.  COMPARISON:  10/08/2020 calcium score  CT  FINDINGS: Vascular: Normal aortic caliber. Aortic atherosclerosis. No central pulmonary embolism, on this non-dedicated study.  Mediastinum/Nodes: No imaged thoracic adenopathy.  Lungs/Pleura: No pleural fluid. Right lower lobe calcified granulomas, including on 30/12.  Upper Abdomen: Normal imaged portions of the liver, spleen, stomach.  Musculoskeletal: No acute osseous abnormality.  IMPRESSION: 1. No acute findings in the imaged extracardiac chest. 2. Aortic Atherosclerosis (ICD10-I70.0).  Electronically Signed: By: Jeronimo Greaves M.D. On: 10/21/2020 08:45     ______________________________________________________________________________________________      Risk Assessment/Calculations:             Physical Exam:   VS:  BP 120/78   Pulse 62   Ht 6\' 2"  (1.88 m)   Wt 186 lb (84.4 kg)   SpO2 96%   BMI 23.88 kg/m    Wt Readings from Last 3 Encounters:  05/29/23 186 lb (84.4 kg)  06/02/22 191 lb (86.6 kg)  07/22/21 184 lb 12.8 oz (83.8 kg)    GEN: Well nourished, well developed in no acute distress NECK: No JVD; No carotid bruits CARDIAC: RRR, no murmurs, rubs, gallops RESPIRATORY:  Clear to auscultation without rales, wheezing or rhonchi  ABDOMEN: Soft, non-tender, non-distended EXTREMITIES:  No edema; No deformity   ASSESSMENT AND PLAN: .   CAD -nonobstructive per coronary CTA in 2022. Stable with no anginal symptoms. No indication for ischemic evaluation.  Heart healthy diet and regular cardiovascular exercise encouraged.  Continue aspirin 81 mg daily, continue Coreg 12.5 mg twice daily, continue fish oil daily.  HTN -his blood pressure is well-controlled at 120/78, continue Coreg 12.5 mg twice daily.  Dyslipidemia -most recent LDL is elevated at 102 on 1/61/0960; LP(a) is normal.  He is intolerant of lipid-lowering agents/unwilling to consider other lipid-lowering modalities including PCSK9 inhibitors.  He was previously on Nexlizet, Zetia-as she self  discontinued.  He has strong feelings about lipid lowering agents and is not willing to consider at this time.   PVCs -currently quiescent, continue metoprolol 12.5 mg twice daily.  DM2-most recent A1c was 6.6 this month, he is not on any antihyperglycemic agents. Daily exercise encouraged.         Dispo:  Follow up in 1 year.   Signed, Flossie Dibble, NP

## 2023-05-29 ENCOUNTER — Encounter: Payer: Self-pay | Admitting: Cardiology

## 2023-05-29 ENCOUNTER — Ambulatory Visit: Payer: PPO | Attending: Cardiology | Admitting: Cardiology

## 2023-05-29 VITALS — BP 120/78 | HR 62 | Ht 74.0 in | Wt 186.0 lb

## 2023-05-29 DIAGNOSIS — E1165 Type 2 diabetes mellitus with hyperglycemia: Secondary | ICD-10-CM

## 2023-05-29 DIAGNOSIS — E782 Mixed hyperlipidemia: Secondary | ICD-10-CM | POA: Diagnosis not present

## 2023-05-29 DIAGNOSIS — I10 Essential (primary) hypertension: Secondary | ICD-10-CM | POA: Diagnosis not present

## 2023-05-29 DIAGNOSIS — I493 Ventricular premature depolarization: Secondary | ICD-10-CM

## 2023-05-29 DIAGNOSIS — I2510 Atherosclerotic heart disease of native coronary artery without angina pectoris: Secondary | ICD-10-CM | POA: Diagnosis not present

## 2023-05-29 NOTE — Patient Instructions (Signed)
 Medication Instructions:  Your physician recommends that you continue on your current medications as directed. Please refer to the Current Medication list given to you today.  *If you need a refill on your cardiac medications before your next appointment, please call your pharmacy*   Lab Work: None Ordered If you have labs (blood work) drawn today and your tests are completely normal, you will receive your results only by: MyChart Message (if you have MyChart) OR A paper copy in the mail If you have any lab test that is abnormal or we need to change your treatment, we will call you to review the results.   Testing/Procedures: None Ordered   Follow-Up: At [facility], you and your health needs are our priority.  As part of our continuing mission to provide you with exceptional heart care, we have created designated Provider Care Teams.  These Care Teams include your primary Cardiologist (physician) and Advanced Practice Providers (APPs -  Physician Assistants and Nurse Practitioners) who all work together to provide you with the care you need, when you need it.  We recommend signing up for the patient portal called "MyChart".  Sign up information is provided on this After Visit Summary.  MyChart is used to connect with patients for Virtual Visits (Telemedicine).  Patients are able to view lab/test results, encounter notes, upcoming appointments, etc.  Non-urgent messages can be sent to your provider as well.   To learn more about what you can do with MyChart, go to [email or web address].    Your next appointment:   1 year

## 2023-08-15 ENCOUNTER — Other Ambulatory Visit: Payer: Self-pay

## 2023-08-15 MED ORDER — AMLODIPINE BESY-BENAZEPRIL HCL 5-20 MG PO CAPS: ORAL_CAPSULE | ORAL | 3 refills | Status: AC

## 2023-08-28 ENCOUNTER — Other Ambulatory Visit: Payer: Self-pay

## 2023-08-28 MED ORDER — CARVEDILOL 25 MG PO TABS: 12.5000 mg | ORAL_TABLET | Freq: Two times a day (BID) | ORAL | 2 refills | Status: AC

## 2024-03-11 ENCOUNTER — Telehealth: Payer: Self-pay | Admitting: Cardiology

## 2024-03-11 ENCOUNTER — Other Ambulatory Visit: Payer: Self-pay

## 2024-03-11 MED ORDER — EZETIMIBE 10 MG PO TABS: 10.0000 mg | ORAL_TABLET | Freq: Every day | ORAL | 0 refills | Status: AC

## 2024-03-11 NOTE — Telephone Encounter (Signed)
 Spoke with pt, PCP has instructed him to restart Zetia . Medication sent to [facility].

## 2024-03-11 NOTE — Telephone Encounter (Signed)
 Patient is needing a refill on his Ezetimibe  10mg . He only has 3 pills left and states that [place] has tried getting in touch with us  to get this refilled. CB #  [phone]
